# Patient Record
Sex: Female | Born: 1980 | Race: White | Hispanic: No | Marital: Married | Smoking: Never smoker
Health system: Southern US, Community
[De-identification: ages and names within clinical notes are randomized; demographics above are authoritative.]

## PROBLEM LIST (undated history)

## (undated) DIAGNOSIS — R7303 Prediabetes: Secondary | ICD-10-CM

## (undated) DIAGNOSIS — K219 Gastro-esophageal reflux disease without esophagitis: Secondary | ICD-10-CM

## (undated) HISTORY — DX: Gastro-esophageal reflux disease without esophagitis: K21.9

## (undated) HISTORY — DX: Prediabetes: R73.03

## (undated) HISTORY — PX: WISDOM TOOTH EXTRACTION: SHX21

---

## 2020-10-01 ENCOUNTER — Ambulatory Visit (INDEPENDENT_AMBULATORY_CARE_PROVIDER_SITE_OTHER): Payer: Self-pay | Admitting: Primary Care

## 2020-11-04 ENCOUNTER — Other Ambulatory Visit: Payer: Self-pay

## 2020-11-04 ENCOUNTER — Encounter: Payer: Self-pay | Admitting: Internal Medicine

## 2020-11-04 ENCOUNTER — Encounter (INDEPENDENT_AMBULATORY_CARE_PROVIDER_SITE_OTHER): Payer: Self-pay

## 2020-11-04 ENCOUNTER — Ambulatory Visit: Payer: 59 | Attending: Internal Medicine | Admitting: Internal Medicine

## 2020-11-04 VITALS — BP 120/85 | HR 86 | Resp 16 | Ht 63.0 in | Wt 167.4 lb

## 2020-11-04 DIAGNOSIS — F488 Other specified nonpsychotic mental disorders: Secondary | ICD-10-CM | POA: Insufficient documentation

## 2020-11-04 DIAGNOSIS — Z7689 Persons encountering health services in other specified circumstances: Secondary | ICD-10-CM

## 2020-11-04 DIAGNOSIS — E663 Overweight: Secondary | ICD-10-CM | POA: Diagnosis not present

## 2020-11-04 DIAGNOSIS — R7303 Prediabetes: Secondary | ICD-10-CM

## 2020-11-04 DIAGNOSIS — M25552 Pain in left hip: Secondary | ICD-10-CM

## 2020-11-04 DIAGNOSIS — G8929 Other chronic pain: Secondary | ICD-10-CM

## 2020-11-04 DIAGNOSIS — G2589 Other specified extrapyramidal and movement disorders: Secondary | ICD-10-CM

## 2020-11-04 DIAGNOSIS — R030 Elevated blood-pressure reading, without diagnosis of hypertension: Secondary | ICD-10-CM

## 2020-11-04 DIAGNOSIS — K219 Gastro-esophageal reflux disease without esophagitis: Secondary | ICD-10-CM

## 2020-11-04 MED ORDER — PANTOPRAZOLE SODIUM 40 MG PO TBEC: 40.0000 mg | DELAYED_RELEASE_TABLET | Freq: Every day | ORAL | 4 refills | Status: DC

## 2020-11-04 NOTE — Progress Notes (Signed)
Patient ID: Maria Lynch, female    DOB: July 09, 1980  MRN: [id number]  CC: New Patient (Initial Visit)   Subjective: Maria Lynch is a 40 y.o. female who presents for new pt visit Her concerns today include:  Relocated from [facility] 12/2019.  Previous PCP was there. Hx of GERD, preDM  GERD: On Protonix.  She is doing well on the medication.  I inquired whether she had ever stopped the medication or previous PCP tried to take her off of it.  She states that she does not do well when the medication is stopped.  She has recurrence of symptoms.  Prediabetes/overweight: BMI is at 29 today.  She is active.  Walks and does yoga every morning.  She feels her eating habits are good.  Eating fruits and vegetables.  She eats various types of meats, states that she is a little carb heavy and drinks 8-10 alcoholic beverages a week in the form of beer and/or wine.  Saw sports doctor few mths ago for pain LT hip and issues with her RT hand.   Pain in the left hip is intermittent.  She notices it when she is doing yoga or stresses the joint.  No pain when she lays on the left side.  Sometimes it is crampy in nature.  No known injury to the hip.  Had negative x-ray that was done by the sports medicine physician whom she saw a few months ago.  Diagnosed with possible bursitis and was given a course of oral steroid which helped while she was taking it.  However symptoms returned upon completion.  She gets cramps in the right hand mainly when she is chopping something or writing.  She does a lot of typing on the computer.  No numbness.  She sometimes notices tingling in the hand.  Symptoms do not wake her up at nights.  She has not noticed any swelling.  She sometimes wears a wrist strap and compression gloves which help.  HM:  last pap 11/2019 and was nl per her report.  MMG (hx of cysts RT breast) last summer. Completed COVID-19 x 3.    Past medical, surgical, family history, social history reviewed and  updated. 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    No current outpatient medications on file prior to visit.   No current facility-administered medications on file prior to visit.    No Known Allergies  Social History   Socioeconomic History   Marital status: Media planner    Spouse name: Not on file   Number of children: 0   Years of education: Not on file   Highest education level: Doctorate  Occupational History   Occupation: minister  Tobacco Use   Smoking status: Not on file   Smokeless tobacco: Never  Vaping Use   Vaping Use: Never used  Substance and Sexual Activity   Alcohol use: Yes    Alcohol/week: 8.0 - 10.0 standard drinks    Types: 8 - 10 Cans of beer per week   Drug use: Yes    Comment: edible marijuana   Sexual activity: Not on file  Other Topics Concern   Not on file  Social History Narrative   Not on file   Social Determinants of Health   Financial Resource Strain: Not on file  Food Insecurity: Not on file  Transportation Needs: Not on file  Physical Activity: Not on file  Stress: Not on file  Social Connections: Not on file  Intimate  Partner Violence: Not on file    Family History  Problem Relation Age of Onset   Ovarian cancer Maternal Grandmother    Diabetes Maternal Grandfather    Heart disease Paternal Grandmother    ROS: Review of Systems Negative except as stated above  PHYSICAL EXAM: BP 120/85   Pulse 86   Resp 16   Ht 5\' 3"  (1.6 m)   Wt 167 lb 6.4 oz (75.9 kg)   LMP 10/21/2020   SpO2 99%   BMI 29.65 kg/m   Physical Exam  General appearance - alert, well appearing, middle-age Caucasian female and in no distress Mental status - normal mood, behavior, speech, dress, motor activity, and thought processes Eyes - pupils equal and reactive, extraocular eye movements intact Neck - supple, no significant adenopathy.  No thyroid enlargement. Chest - clear to auscultation, no wheezes, rales or rhonchi, symmetric air  entry Heart - normal rate, regular rhythm, normal S1, S2, no murmurs, rubs, clicks or gallops Musculoskeletal -no edema or erythema noted of the wrist hands or fingers.  She has good range of motion at both wrists.  Slight flexion of the PIP joint of the right index finger.  Grip 5/5 bilaterally.  Tinel's sign negative. She has good radial and brachial pulses bilaterally. Left hip: No point tenderness over the trochanteric bursa.  She has good range of motion of the hip with mild discomfort on internal rotation and flexion. Extremities -no lower extremity edema.  Good dorsalis pedis and popliteal pulses.    ASSESSMENT AND PLAN: 1. Encounter to establish care Advised that she signs a release for 12/21/2020 to get her records from her previous PCP.  Once I get her records, I can update her health maintenance.  2. Hip pain, chronic, left Of questionable etiology.  History does not suggest bursitis.  X-ray reportedly negative for any abnormal joint findings.  I recommend referral to Ortho for further evaluation. - Ambulatory referral to Orthopedic Surgery  3. Writer's cramp Recommend trying conservative measures like taking a break from writing when she feels the cramps coming on in her hand, doing some flexion and extension of the fingers intermittently or using a stress ball in the hand intermittently.  4. Overweight (BMI 25.0-29.9) Discussed and encourage healthy eating habits.  Encourage cutting back on portion sizes of white carbohydrates, trying to cut back on sugary drinks in the diet and being mindful even of alcoholic beverages that can be sugary, try to eat more lean white meat instead of red meat and incorporating fresh fruits and vegetables into the diet.  Printed information given.  Continue regular exercise. - CBC - Comprehensive metabolic panel - Lipid panel - Hemoglobin A1c  5. Prediabetes See #4 above - Hemoglobin A1c  6. Gastroesophageal reflux disease without  esophagitis Continue Protonix for now.  However I informed her of some of the things that can develop with long-term use of PPIs including magnesium malabsorption and may increase fracture risk. - pantoprazole (PROTONIX) 40 MG tablet; Take 1 tablet (40 mg total) by mouth daily.  Dispense: 30 tablet; Refill: 4  7. Elevated blood pressure reading DASH diet discussed and encouraged.  Plan to recheck blood pressure on subsequent visit.   Patient was given the opportunity to ask questions.  Patient verbalized understanding of the plan and was able to repeat key elements of the plan.   Orders Placed This Encounter  Procedures   CBC   Comprehensive metabolic panel   Lipid panel   Hemoglobin A1c  Ambulatory referral to Orthopedic Surgery      Requested Prescriptions   Signed Prescriptions Disp Refills   pantoprazole (PROTONIX) 40 MG tablet 30 tablet 4    Sig: Take 1 tablet (40 mg total) by mouth daily.    Return in about 4 months (around 03/06/2021) for Sign release to get med records from previous PCP.  Jonah Blue, MD, FACP

## 2020-11-04 NOTE — Patient Instructions (Signed)
Please sign a release at checkout for [place] to get your records from your previous primary doctor.   Healthy Eating Following a healthy eating pattern may help you to achieve and maintain a healthy body weight, reduce the risk of chronic disease, and live a long and productive life. It is important to follow a healthy eating pattern at an appropriate calorie level for your body. Your nutritional needs should be metprimarily through food by choosing a variety of nutrient-rich foods. What are tips for following this plan? Reading food labels Read labels and choose the following: Reduced or low sodium. Juices with 100% fruit juice. Foods with low saturated fats and high polyunsaturated and monounsaturated fats. Foods with whole grains, such as whole wheat, cracked wheat, brown rice, and wild rice. Whole grains that are fortified with folic acid. This is recommended for women who are pregnant or who want to become pregnant. Read labels and avoid the following: Foods with a lot of added sugars. These include foods that contain brown sugar, corn sweetener, corn syrup, dextrose, fructose, glucose, high-fructose corn syrup, honey, invert sugar, lactose, malt syrup, maltose, molasses, raw sugar, sucrose, trehalose, or turbinado sugar. Do not eat more than the following amounts of added sugar per day: 6 teaspoons (25 g) for women. 9 teaspoons (38 g) for men. Foods that contain processed or refined starches and grains. Refined grain products, such as white flour, degermed cornmeal, white bread, and white rice. Shopping Choose nutrient-rich snacks, such as vegetables, whole fruits, and nuts. Avoid high-calorie and high-sugar snacks, such as potato chips, fruit snacks, and candy. Use oil-based dressings and spreads on foods instead of solid fats such as butter, stick margarine, or cream cheese. Limit pre-made sauces, mixes, and "instant" products such as flavored rice, instant noodles, and ready-made  pasta. Try more plant-protein sources, such as tofu, tempeh, black beans, edamame, lentils, nuts, and seeds. Explore eating plans such as the Mediterranean diet or vegetarian diet. Cooking Use oil to saut or stir-fry foods instead of solid fats such as butter, stick margarine, or lard. Try baking, boiling, grilling, or broiling instead of frying. Remove the fatty part of meats before cooking. Steam vegetables in water or broth. Meal planning  At meals, imagine dividing your plate into fourths: One-half of your plate is fruits and vegetables. One-fourth of your plate is whole grains. One-fourth of your plate is protein, especially lean meats, poultry, eggs, tofu, beans, or nuts. Include low-fat dairy as part of your daily diet.  Lifestyle Choose healthy options in all settings, including home, work, school, restaurants, or stores. Prepare your food safely: Wash your hands after handling raw meats. Keep food preparation surfaces clean by regularly washing with hot, soapy water. Keep raw meats separate from ready-to-eat foods, such as fruits and vegetables. Cook seafood, meat, poultry, and eggs to the recommended internal temperature. Store foods at safe temperatures. In general: Keep cold foods at 47F (4.4C) or below. Keep hot foods at 147F (60C) or above. Keep your freezer at [facility] (-17.8C) or below. Foods are no longer safe to eat when they have been between the temperatures of 40-147F (4.4-60C) for more than 2 hours. What foods should I eat? Fruits Aim to eat 2 cup-equivalents of fresh, canned (in natural juice), or frozen fruits each day. Examples of 1 cup-equivalent of fruit include 1 small apple, 8large strawberries, 1 cup canned fruit,  cup dried fruit, or 1 cup 100% juice. Vegetables Aim to eat 2-3 cup-equivalents of fresh and frozen vegetables each day,  including different varieties and colors. Examples of 1 cup-equivalent of vegetables include 2 medium carrots, 2 cups  raw, leafy greens, 1 cup choppedvegetable (raw or cooked), or 1 medium baked potato. Grains Aim to eat 6 ounce-equivalents of whole grains each day. Examples of 1 ounce-equivalent of grains include 1 slice of bread, 1 cup ready-to-eat cereal,3 cups popcorn, or  cup cooked rice, pasta, or cereal. Meats and other proteins Aim to eat 5-6 ounce-equivalents of protein each day. Examples of 1 ounce-equivalent of protein include 1 egg, 1/2 cup nuts or seeds, or 1 tablespoon (16 g) peanut butter. A cut of meat or fish that is the size of a deck of cards is about 3-4 ounce-equivalents. Of the protein you eat each week, try to have at least 8 ounces come from seafood. This includes salmon, trout, herring, and anchovies. Dairy Aim to eat 3 cup-equivalents of fat-free or low-fat dairy each day. Examples of 1 cup-equivalent of dairy include 1 cup (240 mL) milk, 8 ounces (250 g) yogurt,1 ounces (44 g) natural cheese, or 1 cup (240 mL) fortified soy milk. Fats and oils Aim for about 5 teaspoons (21 g) per day. Choose monounsaturated fats, such as canola and olive oils, avocados, peanut butter, and most nuts, or polyunsaturated fats, such as sunflower, corn, and soybean oils, walnuts, pine nuts, sesame seeds, sunflower seeds, and flaxseed. Beverages Aim for six 8-oz glasses of water per day. Limit coffee to three to five 8-oz cups per day. Limit caffeinated beverages that have added calories, such as soda and energy drinks. Limit alcohol intake to no more than 1 drink a day for nonpregnant women and 2 drinks a day for men. One drink equals 12 oz of beer (355 mL), 5 oz of wine (148 mL), or 1 oz of hard liquor (44 mL). Seasoning and other foods Avoid adding excess amounts of salt to your foods. Try flavoring foods with herbs and spices instead of salt. Avoid adding sugar to foods. Try using oil-based dressings, sauces, and spreads instead of solid fats. This information is based on general [place] nutrition  guidelines. For more information, visit [employer]. Exact amounts may vary based on your nutrition needs. Summary A healthy eating plan may help you to maintain a healthy weight, reduce the risk of chronic diseases, and stay active throughout your life. Plan your meals. Make sure you eat the right portions of a variety of nutrient-rich foods. Try baking, boiling, grilling, or broiling instead of frying. Choose healthy options in all settings, including home, work, school, restaurants, or stores. This information is not intended to replace advice given to you by your health care provider. Make sure you discuss any questions you have with your healthcare provider. Document Revised: 08/15/2017 Document Reviewed: 08/15/2017 Elsevier Patient Education  2022 [employer].

## 2020-11-05 LAB — LIPID PANEL
Chol/HDL Ratio: 3.4 ratio (ref 0.0–4.4)
Cholesterol, Total: 186 mg/dL (ref 100–199)
HDL: 54 mg/dL (ref >39)
LDL Chol Calc (NIH): 110 mg/dL — ABNORMAL HIGH (ref 0–99)
Triglycerides: 121 mg/dL (ref 0–149)
VLDL Cholesterol Cal: 22 mg/dL (ref 5–40)

## 2020-11-05 LAB — COMPREHENSIVE METABOLIC PANEL
ALT: 20 IU/L (ref 0–32)
AST: 16 IU/L (ref 0–40)
Albumin/Globulin Ratio: 1.5 (ref 1.2–2.2)
Albumin: 4.3 g/dL (ref 3.8–4.8)
Alkaline Phosphatase: 63 IU/L (ref 44–121)
BUN/Creatinine Ratio: 16 (ref 9–23)
BUN: 12 mg/dL (ref 6–24)
Bilirubin Total: 0.3 mg/dL (ref 0.0–1.2)
CO2: 20 mmol/L (ref 20–29)
Calcium: 9.9 mg/dL (ref 8.7–10.2)
Chloride: 101 mmol/L (ref 96–106)
Creatinine, Ser: 0.73 mg/dL (ref 0.57–1.00)
Globulin, Total: 2.9 g/dL (ref 1.5–4.5)
Glucose: 89 mg/dL (ref 65–99)
Potassium: 4.7 mmol/L (ref 3.5–5.2)
Sodium: 138 mmol/L (ref 134–144)
Total Protein: 7.2 g/dL (ref 6.0–8.5)
eGFR: 107 mL/min/{1.73_m2} (ref >59)

## 2020-11-05 LAB — CBC
Hematocrit: 41.4 % (ref 34.0–46.6)
Hemoglobin: 13.9 g/dL (ref 11.1–15.9)
MCH: 30.7 pg (ref 26.6–33.0)
MCHC: 33.6 g/dL (ref 31.5–35.7)
MCV: 91 fL (ref 79–97)
Platelets: 324 10*3/uL (ref 150–450)
RBC: 4.53 x10E6/uL (ref 3.77–5.28)
RDW: 12.5 % (ref 11.7–15.4)
WBC: 9 10*3/uL (ref 3.4–10.8)

## 2020-11-05 LAB — HEMOGLOBIN A1C
Est. average glucose Bld gHb Est-mCnc: 114 mg/dL
Hgb A1c MFr Bld: 5.6 % (ref 4.8–5.6)

## 2020-11-05 NOTE — Progress Notes (Signed)
Blood cell counts including red blood cells, white blood cells and platelet counts are normal. Kidney and liver function tests are normal. Blood sugar level normal.  She is no longer in the range for prediabetes. LDL cholesterol is 110 with goal being less than 100.  Healthy eating habits and regular exercise as discussed on recent visit will help to lower cholesterol.

## 2020-11-12 ENCOUNTER — Encounter: Payer: Self-pay | Admitting: Internal Medicine

## 2020-11-12 NOTE — Progress Notes (Signed)
Records received from Dr. Katherina Mires.  Notes are hand written.  Patient with diagnosis of GERD, prediabetes, fibrocystic breast disease.  Last LDL cholesterol is in May 2021 and it was 143, A1c was 5.9.  CBC was normal.

## 2020-11-13 ENCOUNTER — Encounter: Payer: Self-pay | Admitting: Orthopaedic Surgery

## 2020-11-13 ENCOUNTER — Ambulatory Visit (INDEPENDENT_AMBULATORY_CARE_PROVIDER_SITE_OTHER): Payer: 59

## 2020-11-13 ENCOUNTER — Ambulatory Visit (INDEPENDENT_AMBULATORY_CARE_PROVIDER_SITE_OTHER): Payer: 59 | Admitting: Orthopaedic Surgery

## 2020-11-13 ENCOUNTER — Telehealth: Payer: Self-pay

## 2020-11-13 VITALS — Ht 63.0 in | Wt 167.0 lb

## 2020-11-13 DIAGNOSIS — M25552 Pain in left hip: Secondary | ICD-10-CM

## 2020-11-13 MED ORDER — LIDOCAINE HCL 1 % IJ SOLN
3.0000 mL | INTRAMUSCULAR | Status: AC | PRN
  Administered 2020-11-13: 3 mL

## 2020-11-13 MED ORDER — METHYLPREDNISOLONE ACETATE 40 MG/ML IJ SUSP
40.0000 mg | INTRAMUSCULAR | Status: AC | PRN
  Administered 2020-11-13: 40 mg via INTRA_ARTICULAR

## 2020-11-13 MED ORDER — BUPIVACAINE HCL 0.25 % IJ SOLN
2.0000 mL | INTRAMUSCULAR | Status: AC | PRN
  Administered 2020-11-13: 2 mL via INTRA_ARTICULAR

## 2020-11-13 NOTE — Telephone Encounter (Signed)
Contacted pt to go over lab results pt didn't answer lvm   Sent a CRM and forward labs to NT to give pt labs when they call back   

## 2020-11-13 NOTE — Progress Notes (Signed)
Office Visit Note   Patient: Maria Lynch           Date of Birth: [birth year]           MRN: [id number] Visit Date: 11/13/2020              Requested by: Marcine Matar, MD [address] [place],  [place] [address] PCP: Marcine Matar, MD   Assessment & Plan: Visit Diagnoses:  1. Pain in left hip     Plan: Impression is chronic lateral hip pain.  The patient symptoms are mostly consistent with trochanteric bursitis given her clinical exam findings even though I think she could have a component of lumbar radiculopathy based on her subjective weakness.  At this point, we have discussed trochanteric bursa injection in addition to iliac band stretching which she is agreeable to.  Should her symptoms not improve over the next several weeks she will let us know.  She will call us with concerns or questions in the meantime.  Follow-Up Instructions: Return if symptoms worsen or fail to improve.   Orders:  Orders Placed This Encounter  Procedures   Large Joint Inj: L greater trochanter   XR HIP UNILAT W OR W/O PELVIS 2-3 VIEWS LEFT   XR Lumbar Spine 2-3 Views   No orders of the defined types were placed in this encounter.     Procedures: Large Joint Inj: L greater trochanter on 11/13/2020 10:00 AM Indications: pain Details: 22 G needle, lateral approach Medications: 3 mL lidocaine 1 %; 2 mL bupivacaine 0.25 %; 40 mg methylPREDNISolone acetate 40 MG/ML     Clinical Data: No additional findings.   Subjective: Chief Complaint  Patient presents with   Left Hip - Pain    HPI patient is a pleasant 40 year old female who comes in today with chronic left hip pain.  She noticed this approximately 9 months ago.  No known injury or change in activity.  The pain is primarily to the lateral hip but occasionally radiates to the anterior thigh.  She describes this as a constant ache with frequent cramping of the left hip and left lower leg.  Her symptoms appear to be worse when  she is going downstairs, squatting, flexing her hip or after sitting for a long period of time.  She has been taking Advil as well as a recent steroid taper which did seem to help.  She notes occasional tingling to the lateral hip.  She denies any previous history of lumbar pathology other than some intermittent pain in the past.  Review of Systems as detailed in HPI.  All others reviewed and are negative.   Objective: Vital Signs: Ht 5\' 3"  (1.6 m)   Wt 167 lb (75.8 kg)   LMP 10/21/2020   BMI 29.58 kg/m   Physical Exam well-nourished female no acute distress.  Alert and oriented x3.  Ortho Exam left hip exam reveals a negative logroll and negative FADIR.  She does have moderate tenderness along the greater trochanter.  Mild increased pain with lumbar extension.  No pain with lumbar flexion.  Negative straight leg raise.  No focal weakness.  She is neurovascular intact distally.  Specialty Comments:  No specialty comments available.  Imaging: XR HIP UNILAT W OR W/O PELVIS 2-3 VIEWS LEFT  Result Date: 11/13/2020 No acute or structural abnormalities  XR Lumbar Spine 2-3 Views  Result Date: 11/13/2020 moderate disc space narrowing L5-S1    PMFS History: Patient Active Problem List  Diagnosis Date Noted   Hip pain, chronic, left 11/04/2020   Writer's cramp 11/04/2020   Overweight (BMI 25.0-29.9) 11/04/2020   Prediabetes 11/04/2020   Gastroesophageal reflux disease without esophagitis 11/04/2020   Past Medical History:  Diagnosis Date   GERD (gastroesophageal reflux disease)    Prediabetes     Family History  Problem Relation Age of Onset   Ovarian cancer Maternal Grandmother    Diabetes Maternal Grandfather    Heart disease Paternal Grandmother     Past Surgical History:  Procedure Laterality Date   WISDOM TOOTH EXTRACTION     Social History   Occupational History   Occupation: Optician, dispensing  Tobacco Use   Smoking status: Never   Smokeless tobacco: Never  Vaping Use    Vaping Use: Never used  Substance and Sexual Activity   Alcohol use: Yes    Alcohol/week: 8.0 - 10.0 standard drinks    Types: 8 - 10 Cans of beer per week   Drug use: Yes    Comment: edible marijuana   Sexual activity: Not on file

## 2020-11-20 ENCOUNTER — Telehealth: Payer: Self-pay | Admitting: Internal Medicine

## 2020-11-20 DIAGNOSIS — Z1231 Encounter for screening mammogram for malignant neoplasm of breast: Secondary | ICD-10-CM

## 2020-11-20 DIAGNOSIS — N6011 Diffuse cystic mastopathy of right breast: Secondary | ICD-10-CM

## 2020-11-20 DIAGNOSIS — N6019 Diffuse cystic mastopathy of unspecified breast: Secondary | ICD-10-CM

## 2020-11-20 NOTE — Telephone Encounter (Signed)
Returned pt call to see why she is needing a breast ultrasound. Pt didn't answer lvm

## 2020-11-20 NOTE — Telephone Encounter (Signed)
Patient called in wanting to speak to [facility] but no answer please call patient at Ph# [phone]

## 2020-11-20 NOTE — Telephone Encounter (Signed)
Copied from CRM [id number]. Topic: General - Other >> Nov 14, 2020 12:10 PM Traci Sermon wrote: Reason for CRM: Pt called in wanting to get orders for Diagnose, for [facility], for a breast Ultrasound, Please advise.

## 2020-11-21 NOTE — Telephone Encounter (Signed)
Returned pt call to get more information on why she needing is ultrasound done. Pt states she had an appt scheduled for 01/23/21 with the breast center but they canceled it because they are needing an order entered to have detail Diag and possible U/S due to current issues. Pt states she has a cluster of cysts in my right breast

## 2020-11-27 ENCOUNTER — Other Ambulatory Visit: Payer: Self-pay | Admitting: Internal Medicine

## 2020-11-27 DIAGNOSIS — Z1231 Encounter for screening mammogram for malignant neoplasm of breast: Secondary | ICD-10-CM

## 2020-12-15 ENCOUNTER — Encounter: Payer: Self-pay | Admitting: Orthopaedic Surgery

## 2020-12-16 ENCOUNTER — Other Ambulatory Visit: Payer: Self-pay

## 2020-12-16 DIAGNOSIS — M25552 Pain in left hip: Secondary | ICD-10-CM

## 2021-01-01 ENCOUNTER — Ambulatory Visit: Payer: 59 | Admitting: Rehabilitative and Restorative Service Providers"

## 2021-01-05 ENCOUNTER — Encounter: Payer: Self-pay | Admitting: Physical Therapy

## 2021-01-05 ENCOUNTER — Other Ambulatory Visit: Payer: Self-pay

## 2021-01-05 ENCOUNTER — Ambulatory Visit (INDEPENDENT_AMBULATORY_CARE_PROVIDER_SITE_OTHER): Payer: 59 | Admitting: Physical Therapy

## 2021-01-05 DIAGNOSIS — R2689 Other abnormalities of gait and mobility: Secondary | ICD-10-CM | POA: Diagnosis not present

## 2021-01-05 DIAGNOSIS — M25652 Stiffness of left hip, not elsewhere classified: Secondary | ICD-10-CM

## 2021-01-05 DIAGNOSIS — M25552 Pain in left hip: Secondary | ICD-10-CM

## 2021-01-05 DIAGNOSIS — M6281 Muscle weakness (generalized): Secondary | ICD-10-CM | POA: Diagnosis not present

## 2021-01-05 NOTE — Patient Instructions (Signed)
Access Code: [email or web address] URL: [email or web address] Date: 01/05/2021 Prepared by: Moshe Cipro  Exercises Supine Bridge - 1-2 x daily - 7 x weekly - 2-3 sets - 10 reps - 5 sec hold Clamshell with Resistance - 1-2 x daily - 7 x weekly - 2-3 sets - 10 reps Mini Squat with Counter Support - 1-2 x daily - 7 x weekly - 2-3 sets - 10 reps Kettlebell Deadlift - 1-2 x daily - 7 x weekly - 2-3 sets - 10 reps Standing Single Leg Stance with Unilateral Counter Support - 1-2 x daily - 7 x weekly - 2-3 sets - 3 reps - 10 sec hold  Patient Education Trigger Point Dry Needling

## 2021-01-05 NOTE — Therapy (Signed)
[facility] Physical Therapy [address] [place], [place], [address] Phone: [phone]   Fax:  [phone]  Physical Therapy Evaluation  Patient Details  Name: Maria Lynch MRN: [id number] Date of Birth: 07-18-1980 Referring Provider (PT): Tarry Kos, MD   Encounter Date: 01/05/2021   PT End of Session - 01/05/21 1103     Visit Number 1    Number of Visits 6    Date for PT Re-Evaluation 02/16/21    Authorization Type [employer]    PT Start Time 1018    PT Stop Time 1056    PT Time Calculation (min) 38 min    Activity Tolerance Patient tolerated treatment well    Behavior During Therapy [facility] for tasks assessed/performed             Past Medical History:  Diagnosis Date   GERD (gastroesophageal reflux disease)    Prediabetes     Past Surgical History:  Procedure Laterality Date   WISDOM TOOTH EXTRACTION      There were no vitals filed for this visit.    Subjective Assessment - 01/05/21 1020     Subjective Pt is a 40 y/o female who presents to OPPT for Lt hip pain since Oct 2021 without known injury.  She reports prednisone was mildly helpful, as well as injection which hasn't provided significant relief.  Xrays of hip WNL, mild arthritis in back.    Limitations Walking    How long can you walk comfortably? starts limping, walks 20-30 min most days    Diagnostic tests xrays    Patient Stated Goals improve pain, walk without a limp, go up/down stairs    Currently in Pain? Yes    Pain Score 2    up to 4/10; at best 0/10   Pain Location Hip    Pain Orientation Left    Pain Descriptors / Indicators Aching;Dull;Cramping    Pain Type Chronic pain    Pain Onset More than a month ago    Pain Frequency Intermittent    Aggravating Factors  walking, stairs, intercourse    Pain Relieving Factors stretches (ITB), rest, advil                [facility] PT Assessment - 01/05/21 1025       Assessment   Medical Diagnosis M25.552 (ICD-10-CM)  - Pain in left hip  chronic    Referring Provider (PT) Tarry Kos, MD    Onset Date/Surgical Date --   Oct 2021   Hand Dominance Right    Next MD Visit PRN    Prior Therapy none recently      Precautions   Precautions None      Restrictions   Weight Bearing Restrictions No      Balance Screen   Has the patient fallen in the past 6 months No    Has the patient had a decrease in activity level because of a fear of falling?  No    Is the patient reluctant to leave their home because of a fear of falling?  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, some travel    [employer], watch TV, yoga every morning, daily walking      Cognition   Overall Cognitive Status Within Functional Limits for tasks assessed      Observation/Other Assessments   Focus on Therapeutic Outcomes (FOTO)  59 (predicted 70)      ROM / Strength   AROM / PROM / Strength AROM;Strength      AROM   Overall AROM Comments other hip motions grossly WNL    AROM Assessment Site Hip    Right/Left Hip Right;Left    Right Hip External Rotation  27    Right Hip Internal Rotation  29    Left Hip External Rotation  16    Left Hip Internal Rotation  27      Strength   Strength Assessment Site Hip;Knee    Right/Left Hip Right;Left    Right Hip Flexion 4/5    Right Hip Extension 4/5    Right Hip External Rotation  3+/5    Right Hip Internal Rotation 4/5    Left Hip Flexion 3+/5    Left Hip Extension 3+/5    Left Hip External Rotation 3/5    Left Hip Internal Rotation 3/5    Left Hip ABduction 4/5    Right/Left Knee Right;Left     Right Knee Flexion 5/5    Right Knee Extension 5/5    Left Knee Flexion 5/5    Left Knee Extension 5/5      Palpation   Palpation comment trigger points noted in Lt TFL and glute min/piriformis; tender to palpation at post greater trochanter      Special Tests    Special Tests Lumbar    Lumbar Tests Slump Test      Slump test   Findings Negative      Ambulation/Gait   Gait Comments no significant deviations noted during short distances in clinic, pt reports increased antalgic gait with walking longer distances                        Objective measurements completed on examination: See above findings.       [facility] Adult PT Treatment/Exercise - 01/05/21 1025       Exercises   Exercises Other Exercises    Other Exercises  verbally reviewed strengthening exercises with pt - will formally review next visit; recommended continued current exercise program as well including stretches, yoga and daily walking      Manual Therapy   Manual Therapy Soft tissue mobilization    Soft tissue mobilization Lt TFL with compression              Trigger Point Dry Needling - 01/05/21 1107     Consent Given? Yes    Education Handout Provided Yes    Muscles Treated Back/Hip Tensor fascia lata    Tensor Fascia Lata Response Twitch response elicited                  PT Education - 01/05/21 1102     Education Details HEP, DN    Person(s) Educated Patient    Methods Explanation;Demonstration;Handout    Comprehension Verbalized understanding;Returned demonstration;Need further instruction              PT Short Term Goals - 01/05/21 1111       PT SHORT TERM GOAL #1  Title independent with initial HEP    Time 3    Period Weeks    Status New    Target Date 01/26/21               PT Long Term Goals - 01/05/21 1111       PT LONG TERM GOAL #1   Title independent with final HEP    Time 6    Period Weeks    Status New    Target Date 02/16/21       PT LONG TERM GOAL #2   Title FOTO score improved to 70 for improved function    Time 6    Period Weeks    Status New    Target Date 02/16/21      PT LONG TERM GOAL #3   Title demonstrate at least 4/5 Lt hip strength for improved function    Time 6    Period Weeks    Status New    Target Date 02/16/21      PT LONG TERM GOAL #4   Title negotiate stairs reciprocally without increase in pain for improved function    Time 6    Period Weeks    Status New    Target Date 02/16/21      PT LONG TERM GOAL #5   Title report pain < 3/10 with increased activity for improved function    Time 6    Period Weeks    Status New    Target Date 02/16/21                    Plan - 01/05/21 1107     Clinical Impression Statement Pt is a 40 y/o female who presents to OPPT for chronic Lt hip pain.  She presents with decreased strength (Lt > Rt) as well as decreased ROM, and noted trigger points in Lt hip muscles.  Trial of DN to TFL today to see how pt responds.  Pt will benefit from PT to address deficits listed.    Personal Factors and Comorbidities Fitness;Time since onset of injury/illness/exacerbation    Examination-Activity Limitations Locomotion Level;Transfers;Stand;Stairs    Examination-Participation Restrictions Church;Yard Work;Community Activity;Occupation;Driving;Interpersonal Relationship    Stability/Clinical Decision Making Evolving/Moderate complexity    Clinical Decision Making Moderate    Rehab Potential Good    PT Frequency 1x / week    PT Duration 6 weeks    PT Treatment/Interventions ADLs/Self Care Home Management;Cryotherapy;Electrical Stimulation;Ultrasound;Traction;Moist Heat;Gait training;Stair training;Functional mobility training;Therapeutic activities;Therapeutic exercise;Balance training;Patient/family education;Neuromuscular re-education;Manual techniques;Taping;Dry needling;Passive range of motion    PT Next Visit Plan review HEP, assess response to  DN/manual therapy, continue with strengthening and modalities PRN    PT Home Exercise Plan Access Code: [email or web address]    Consulted and Agree with Plan of Care Patient             Patient will benefit from skilled therapeutic intervention in order to improve the following deficits and impairments:  Abnormal gait, Increased fascial restricitons, Pain, Increased muscle spasms, Decreased mobility, Decreased range of motion, Decreased strength, Impaired flexibility, Difficulty walking, Decreased balance  Visit Diagnosis: Pain in left hip - Plan: PT plan of care cert/re-cert  Stiffness of left hip, not elsewhere classified - Plan: PT plan of care cert/re-cert  Muscle weakness (generalized) - Plan: PT plan of care cert/re-cert  Other abnormalities of gait and mobility - Plan: PT plan of care cert/re-cert     Problem List Patient Active Problem List   Diagnosis  Date Noted   Hip pain, chronic, left 11/04/2020   Writer's cramp 11/04/2020   Overweight (BMI 25.0-29.9) 11/04/2020   Prediabetes 11/04/2020   Gastroesophageal reflux disease without esophagitis 11/04/2020      Clarita CraneStephanie F , PT, DPT 01/05/21 11:15 AM     [facility] [address] [place], [place], [address] Phone: [phone]   Fax:  [phone]  Name: Maria Kubashley Lynch MRN: [id number] Date of Birth: 02/29/1981

## 2021-01-21 ENCOUNTER — Ambulatory Visit (INDEPENDENT_AMBULATORY_CARE_PROVIDER_SITE_OTHER): Payer: 59 | Admitting: Physical Therapy

## 2021-01-21 ENCOUNTER — Other Ambulatory Visit: Payer: Self-pay

## 2021-01-21 ENCOUNTER — Encounter: Payer: Self-pay | Admitting: Physical Therapy

## 2021-01-21 DIAGNOSIS — M25552 Pain in left hip: Secondary | ICD-10-CM

## 2021-01-21 DIAGNOSIS — M6281 Muscle weakness (generalized): Secondary | ICD-10-CM

## 2021-01-21 DIAGNOSIS — M25652 Stiffness of left hip, not elsewhere classified: Secondary | ICD-10-CM

## 2021-01-21 DIAGNOSIS — R2689 Other abnormalities of gait and mobility: Secondary | ICD-10-CM | POA: Diagnosis not present

## 2021-01-21 NOTE — Therapy (Signed)
[facility] Physical Therapy [address] [place], [place], [address] Phone: [phone]   Fax:  [phone]  Physical Therapy Treatment  Patient Details  Name: Maria Lynch MRN: [id number] Date of Birth: November 12, 1980 Referring Provider (PT): Tarry Kos, MD   Encounter Date: 01/21/2021   PT End of Session - 01/21/21 1512     Visit Number 2    Number of Visits 6    Date for PT Re-Evaluation 02/16/21    Authorization Type [employer]    PT Start Time 1435    PT Stop Time 1510    PT Time Calculation (min) 35 min    Activity Tolerance Patient tolerated treatment well    Behavior During Therapy [facility] for tasks assessed/performed             Past Medical History:  Diagnosis Date   GERD (gastroesophageal reflux disease)    Prediabetes     Past Surgical History:  Procedure Laterality Date   WISDOM TOOTH EXTRACTION      There were no vitals filed for this visit.   Subjective Assessment - 01/21/21 1437     Subjective feels like gait is improving, and hip is feeling better.  fell one time while in [place].  stairs were a challenge though.    Limitations Walking    How long can you walk comfortably? starts limping, walks 20-30 min most days    Diagnostic tests xrays    Patient Stated Goals improve pain, walk without a limp, go up/down stairs    Currently in Pain? No/denies                               [facility] Adult PT Treatment/Exercise - 01/21/21 1438       Exercises   Exercises Knee/Hip    Other Exercises  reviewed HEP and discussed cues for squatting, also ways to progress SLS to incorporate glute med activation      Knee/Hip Exercises: Stretches   Other Knee/Hip Stretches prone on elbows x 3 min      Knee/Hip Exercises: Aerobic   Recumbent Bike L3 x 6 min      Manual Therapy   Manual Therapy Soft tissue mobilization    Soft tissue mobilization Lt TFL and glute med with compression, also used percussive device for STM  to glute med              Trigger Point Dry Needling - 01/21/21 1512     Consent Given? Yes    Education Handout Provided Previously provided    Muscles Treated Back/Hip Gluteus medius;Tensor fascia lata    Gluteus Medius Response Twitch response elicited    Tensor Fascia Lata Response Twitch response elicited                Upper Extremity Functional Index Score :   /80     PT Short Term Goals - 01/21/21 1513       PT SHORT TERM GOAL #1   Title independent with initial HEP    Time 3    Period Weeks    Status Achieved    Target Date 01/26/21               PT Long Term Goals - 01/21/21 1513       PT LONG TERM GOAL #1   Title independent with final HEP    Time 6    Period Weeks  Status On-going    Target Date 02/16/21      PT LONG TERM GOAL #2   Title FOTO score improved to 70 for improved function    Time 6    Period Weeks    Status On-going    Target Date 02/16/21      PT LONG TERM GOAL #3   Title demonstrate at least 4/5 Lt hip strength for improved function    Time 6    Period Weeks    Status On-going    Target Date 02/16/21      PT LONG TERM GOAL #4   Title negotiate stairs reciprocally without increase in pain for improved function    Time 6    Period Weeks    Status On-going    Target Date 02/16/21      PT LONG TERM GOAL #5   Title report pain < 3/10 with increased activity for improved function    Time 6    Period Weeks    Status On-going    Target Date 02/16/21                   Plan - 01/21/21 1514     Clinical Impression Statement Pt tolerated session well today with positive initial response to manual therapy and DN.  Still has some difficulty with stairs and standing from lower surfaces so will continue to benefit from PT to maximize function.  Did notice Lt > Rt foot slap today with amb, which pt reports may be baseline.  Advised her to monitor for any possible changes and if any weakness progresses to  contact MD.    Personal Factors and Comorbidities Fitness;Time since onset of injury/illness/exacerbation    Examination-Activity Limitations Locomotion Level;Transfers;Stand;Stairs    Examination-Participation Restrictions Church;Yard Work;Community Activity;Occupation;Driving;Interpersonal Relationship    Stability/Clinical Decision Making Evolving/Moderate complexity    Rehab Potential Good    PT Frequency 1x / week    PT Duration 6 weeks    PT Treatment/Interventions ADLs/Self Care Home Management;Cryotherapy;Electrical Stimulation;Ultrasound;Traction;Moist Heat;Gait training;Stair training;Functional mobility training;Therapeutic activities;Therapeutic exercise;Balance training;Patient/family education;Neuromuscular re-education;Manual techniques;Taping;Dry needling;Passive range of motion    PT Next Visit Plan assess response to DN/manual therapy, continue with strengthening and modalities PRN, reassess strength    PT Home Exercise Plan Access Code: [email or web address]    Consulted and Agree with Plan of Care Patient             Patient will benefit from skilled therapeutic intervention in order to improve the following deficits and impairments:  Abnormal gait, Increased fascial restricitons, Pain, Increased muscle spasms, Decreased mobility, Decreased range of motion, Decreased strength, Impaired flexibility, Difficulty walking, Decreased balance  Visit Diagnosis: Pain in left hip  Stiffness of left hip, not elsewhere classified  Muscle weakness (generalized)  Other abnormalities of gait and mobility     Problem List Patient Active Problem List   Diagnosis Date Noted   Hip pain, chronic, left 11/04/2020   Writer's cramp 11/04/2020   Overweight (BMI 25.0-29.9) 11/04/2020   Prediabetes 11/04/2020   Gastroesophageal reflux disease without esophagitis 11/04/2020    Moshe Cipro, PT, DPT 01/21/2021, 3:16 PM  [facility] Physical Therapy [address] [place], [place], [address] Phone: [phone]   Fax:  [phone]  Name: Maria Lynch MRN: [id number] Date of Birth: 1980/07/02

## 2021-01-22 ENCOUNTER — Ambulatory Visit
Admission: RE | Admit: 2021-01-22 | Discharge: 2021-01-22 | Disposition: A | Discharge status: Home / Self Care | Payer: 59 | Source: Ambulatory Visit | Attending: Internal Medicine | Admitting: Internal Medicine

## 2021-01-22 DIAGNOSIS — Z1231 Encounter for screening mammogram for malignant neoplasm of breast: Secondary | ICD-10-CM

## 2021-01-22 IMAGING — MG MM DIGITAL SCREENING BILAT W/ TOMO AND CAD
8 series · 8 of 24 positions shown · non-contrast
Comparison: Previous exam(s).

CLINICAL DATA: Screening.

EXAM:
DIGITAL SCREENING BILATERAL MAMMOGRAM WITH TOMOSYNTHESIS AND CAD
TECHNIQUE: Bilateral screening digital craniocaudal and mediolateral oblique
mammograms were obtained. Bilateral screening digital breast
tomosynthesis was performed. The images were evaluated with
computer-aided detection.

[L CC synth-2D]
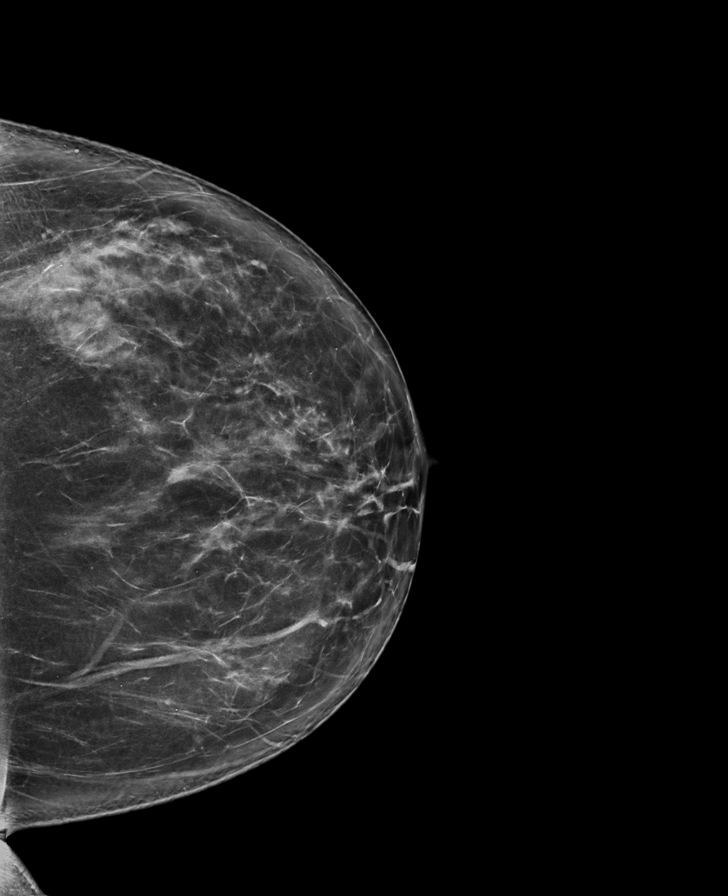

[R MLO synth-2D]
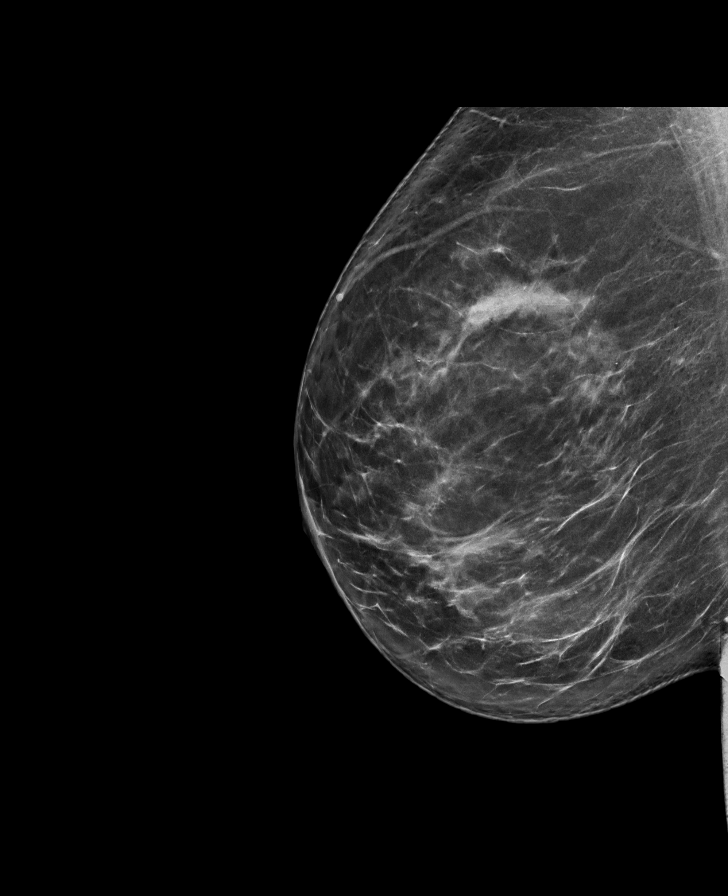

[L MLO synth-2D]
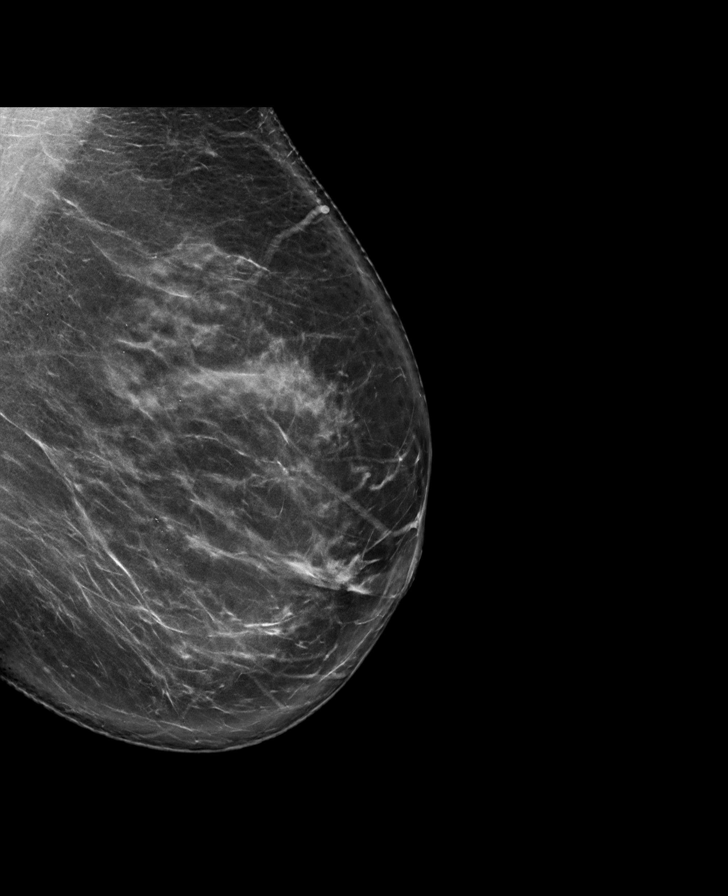

[R CC synth-2D]
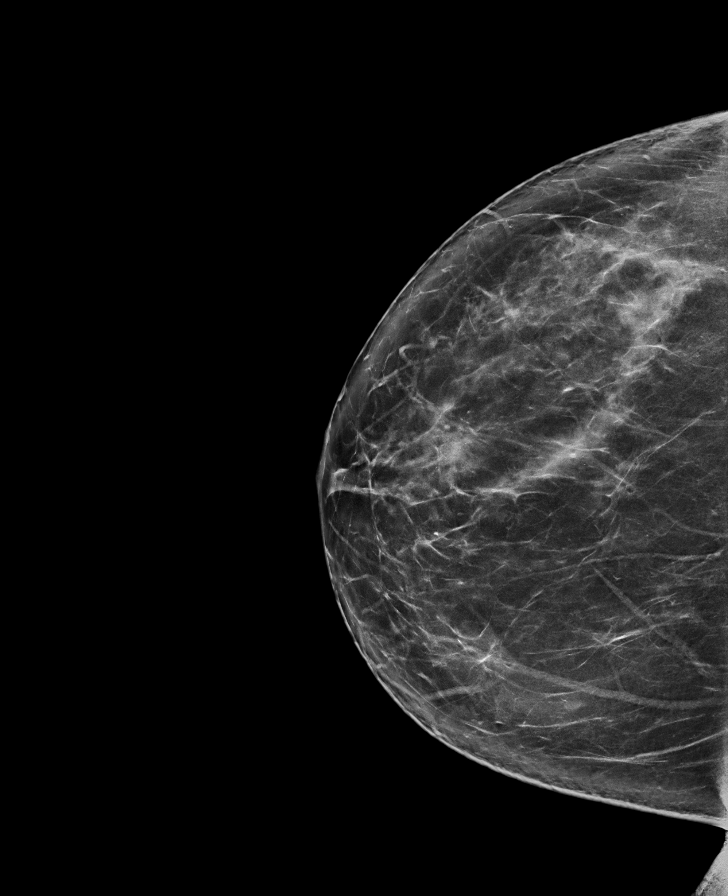

[R CC tomo · tomo slice 45/88.0]
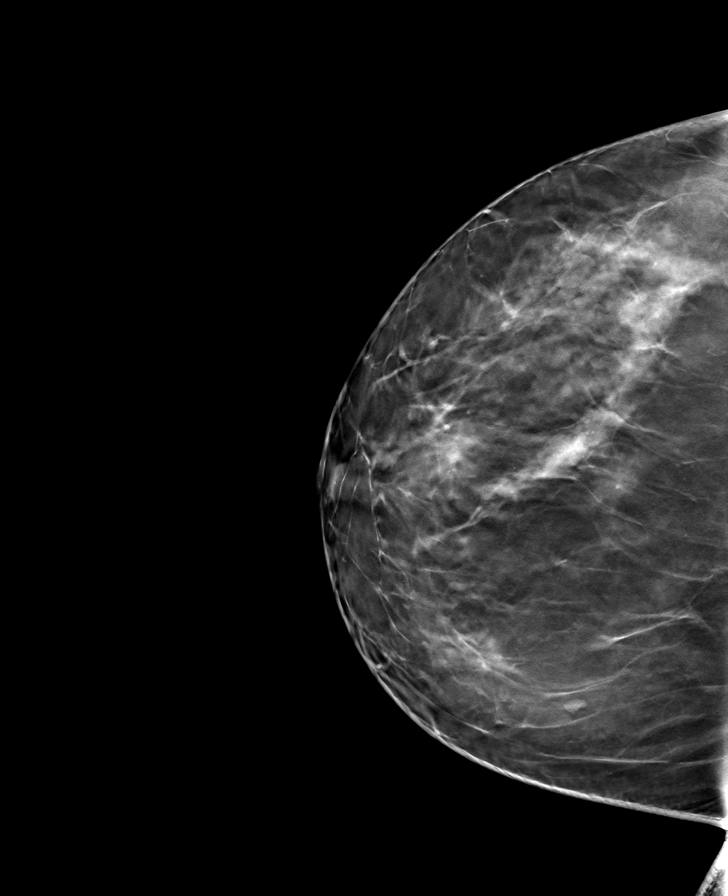

[L MLO tomo · tomo slice 51/100.0]
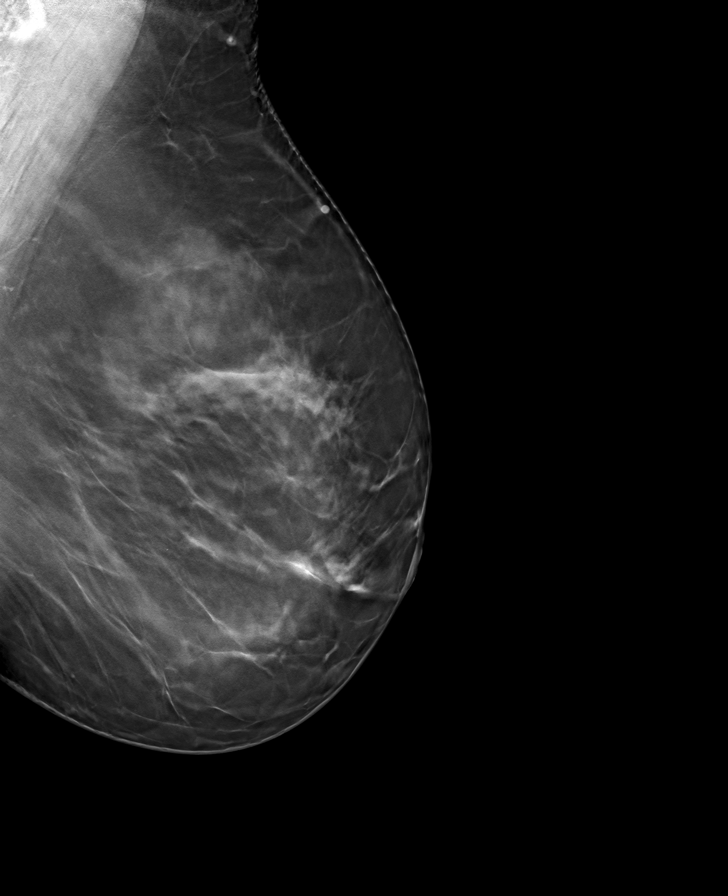

[R MLO tomo · tomo slice 49/96.0]
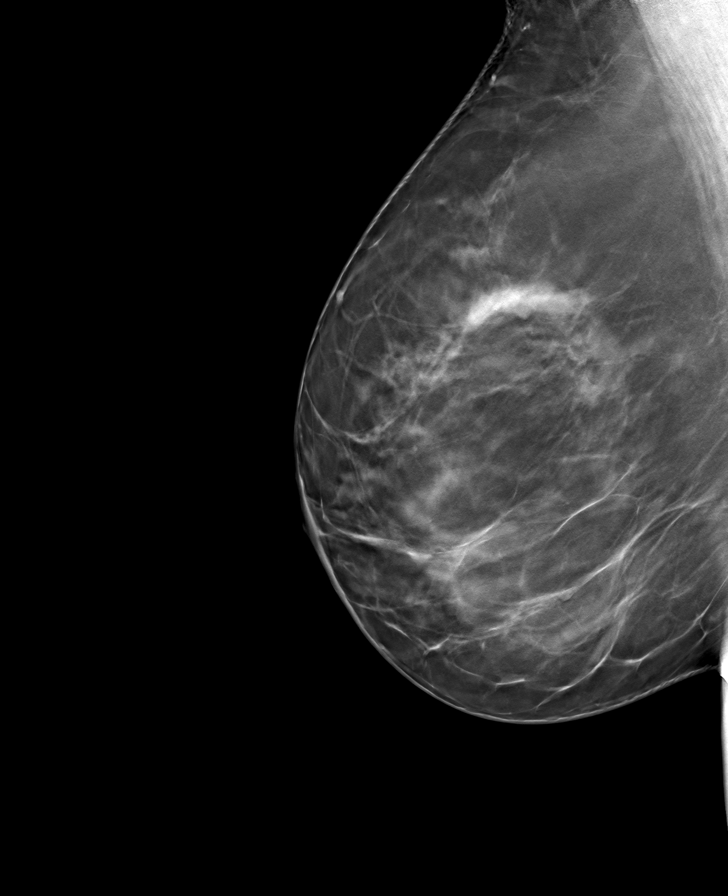

[L CC tomo · tomo slice 45/89.0]
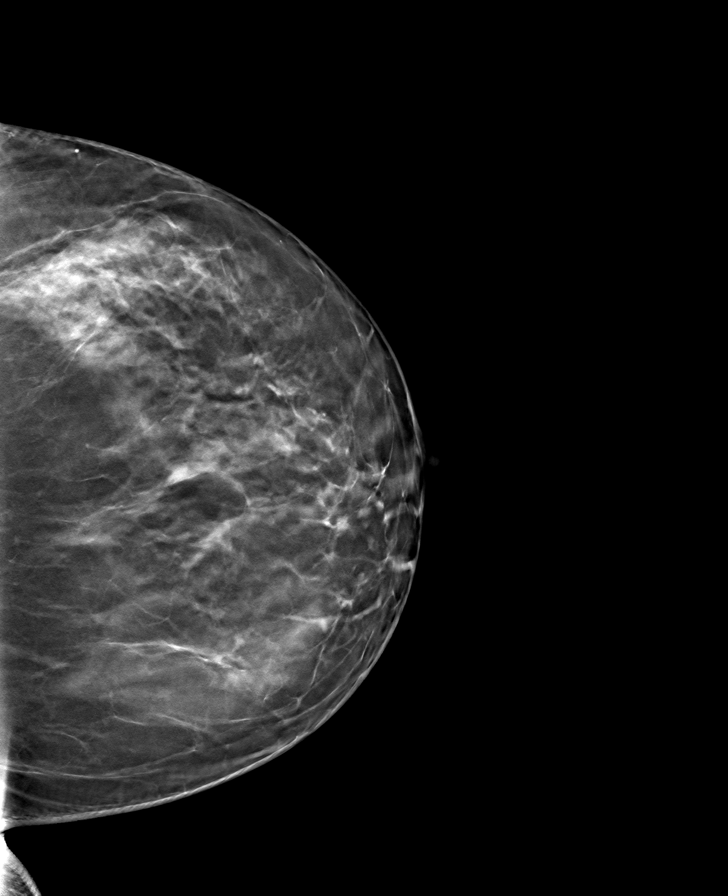

[8 of 24 positions shown; findings below may reference images not displayed]

ACR Breast Density Category b: There are scattered areas of
fibroglandular density.
FINDINGS: There are no findings suspicious for malignancy.
IMPRESSION: No mammographic evidence of malignancy. A result letter of this
screening mammogram will be mailed directly to the patient.

RECOMMENDATION:
Screening mammogram in one year. (Code:[BY])

BI-RADS CATEGORY  1: Negative.

## 2021-01-27 ENCOUNTER — Encounter: Payer: 59 | Admitting: Physical Therapy

## 2021-02-03 ENCOUNTER — Encounter: Payer: Self-pay | Admitting: Physical Therapy

## 2021-02-03 ENCOUNTER — Ambulatory Visit (INDEPENDENT_AMBULATORY_CARE_PROVIDER_SITE_OTHER): Payer: 59 | Admitting: Physical Therapy

## 2021-02-03 ENCOUNTER — Other Ambulatory Visit: Payer: Self-pay

## 2021-02-03 DIAGNOSIS — M25552 Pain in left hip: Secondary | ICD-10-CM | POA: Diagnosis not present

## 2021-02-03 DIAGNOSIS — R2689 Other abnormalities of gait and mobility: Secondary | ICD-10-CM

## 2021-02-03 DIAGNOSIS — M6281 Muscle weakness (generalized): Secondary | ICD-10-CM | POA: Diagnosis not present

## 2021-02-03 DIAGNOSIS — M25652 Stiffness of left hip, not elsewhere classified: Secondary | ICD-10-CM | POA: Diagnosis not present

## 2021-02-03 NOTE — Patient Instructions (Signed)
Access Code: [email or web address] URL: [email or web address] Date: 02/03/2021 Prepared by: Moshe Cipro  Exercises Supine Bridge - 1-2 x daily - 7 x weekly - 2-3 sets - 10 reps - 5 sec hold Clamshell with Resistance - 1-2 x daily - 7 x weekly - 2-3 sets - 10 reps Mini Squat with Counter Support - 1-2 x daily - 7 x weekly - 2-3 sets - 10 reps Kettlebell Deadlift - 1-2 x daily - 7 x weekly - 2-3 sets - 10 reps Standing Single Leg Stance with Unilateral Counter Support - 1-2 x daily - 7 x weekly - 2-3 sets - 3 reps - 10 sec hold Prone on Elbows Stretch - 5-8 x daily - 7 x weekly - 1 sets - 1 reps - 3 min hold Prone Press Up - 5-8 x daily - 7 x weekly - 1 sets - 10 reps - 10 sec hold Standing Lumbar Extension at Wall - Forearms - 5-8 x daily - 7 x weekly - 1 sets - 10 reps - 10 sec hold  Patient Education Trigger Point Dry Needling

## 2021-02-03 NOTE — Therapy (Addendum)
[facility] Physical Therapy [address] [place], [place], [address] Phone: [phone]   Fax:  [phone]  Physical Therapy Treatment /Discharge  Patient Details  Name: Maria Lynch MRN: [id number] Date of Birth: 1980/11/11 Referring Provider (PT): Leandrew Koyanagi, MD   Encounter Date: 02/03/2021   PT End of Session - 02/03/21 1413     Visit Number 3    Number of Visits 6    Date for PT Re-Evaluation 02/16/21    Authorization Type [employer]    PT Start Time 0940    PT Stop Time 1012    PT Time Calculation (min) 32 min    Activity Tolerance Patient tolerated treatment well    Behavior During Therapy [facility] for tasks assessed/performed             Past Medical History:  Diagnosis Date   GERD (gastroesophageal reflux disease)    Prediabetes     Past Surgical History:  Procedure Laterality Date   WISDOM TOOTH EXTRACTION      There were no vitals filed for this visit.   Subjective Assessment - 02/03/21 0945     Subjective still having trouble with stairs    Limitations Walking    How long can you walk comfortably? starts limping, walks 20-30 min most days    Diagnostic tests xrays    Patient Stated Goals improve pain, walk without a limp, go up/down stairs    Currently in Pain? Yes    Pain Score 2     Pain Location Hip    Pain Orientation Left    Pain Descriptors / Indicators Aching;Dull;Cramping    Pain Type Chronic pain    Pain Onset More than a month ago    Pain Frequency Intermittent    Aggravating Factors  walking, stairs, intercourse    Pain Relieving Factors stretches, rest, advil                [facility] PT Assessment - 02/03/21 1004       Assessment   Medical Diagnosis M25.552 (ICD-10-CM) - Pain in left hip  chronic    Referring Provider (PT) Leandrew Koyanagi, MD      Observation/Other Assessments   Focus on Therapeutic Outcomes (FOTO)  57      Strength   Right Hip Flexion 4/5    Right Hip Extension 4/5    Right Hip  External Rotation  3+/5    Right Hip Internal Rotation 4/5    Right Hip ADduction 4/5    Left Hip Flexion 3/5    Left Hip Extension 3/5    Left Hip External Rotation 3/5    Left Hip Internal Rotation 4/5    Left Hip ABduction 3/5    Right Knee Flexion 5/5    Right Knee Extension 5/5    Left Knee Flexion 4/5    Left Knee Extension 5/5    Right Ankle Dorsiflexion 4/5    Right Ankle Plantar Flexion 3/5    Left Ankle Dorsiflexion 2/5    Left Ankle Plantar Flexion 2/5      Slump test   Findings Positive    Side Left                           [facility] Adult PT Treatment/Exercise - 02/03/21 1004       Ambulation/Gait   Gait Comments increased foot slap/foot drop noted with amb, decreased push off noted with gait; unable to  descend stairs without trunk rotation due to weakness      Exercises   Exercises Lumbar      Lumbar Exercises: Stretches   Standing Extension 10 reps;10 seconds    Prone on Elbows Stretch 3 reps;60 seconds   continuous   Press Ups 10 reps;10 seconds                     PT Education - 02/03/21 1413     Education Details HEP    Person(s) Educated Patient    Methods Explanation;Demonstration;Handout    Comprehension Verbalized understanding;Returned demonstration;Need further instruction              PT Short Term Goals - 01/21/21 1513       PT SHORT TERM GOAL #1   Title independent with initial HEP    Time 3    Period Weeks    Status Achieved    Target Date 01/26/21               PT Long Term Goals - 01/21/21 1513       PT LONG TERM GOAL #1   Title independent with final HEP    Time 6    Period Weeks    Status On-going    Target Date 02/16/21      PT LONG TERM GOAL #2   Title FOTO score improved to 70 for improved function    Time 6    Period Weeks    Status On-going    Target Date 02/16/21      PT LONG TERM GOAL #3   Title demonstrate at least 4/5 Lt hip strength for improved function    Time 6     Period Weeks    Status On-going    Target Date 02/16/21      PT LONG TERM GOAL #4   Title negotiate stairs reciprocally without increase in pain for improved function    Time 6    Period Weeks    Status On-going    Target Date 02/16/21      PT LONG TERM GOAL #5   Title report pain < 3/10 with increased activity for improved function    Time 6    Period Weeks    Status On-going    Target Date 02/16/21                   Plan - 02/03/21 1414     Clinical Impression Statement Pt arrived to PT session today with changes noted in gait and observed descending stairs today.  She is unable to descend stairs without rotating trunk due to weakness in bil LEs with Lt worse than Rt.  Reassessment of MMT demonstrating slightly decreased strength in LLE compared to initial eval.  Feel symptoms are coming more from back rather than hip at this time and recommend she return to MD for further assessment.  Will send note to MD today.    Personal Factors and Comorbidities Fitness;Time since onset of injury/illness/exacerbation    Examination-Activity Limitations Locomotion Level;Transfers;Stand;Stairs    Examination-Participation Restrictions Church;Yard Work;Community Activity;Occupation;Driving;Interpersonal Relationship    Stability/Clinical Decision Making Evolving/Moderate complexity    Rehab Potential Good    PT Frequency 1x / week    PT Duration 6 weeks    PT Treatment/Interventions ADLs/Self Care Home Management;Cryotherapy;Electrical Stimulation;Ultrasound;Traction;Moist Heat;Gait training;Stair training;Functional mobility training;Therapeutic activities;Therapeutic exercise;Balance training;Patient/family education;Neuromuscular re-education;Manual techniques;Taping;Dry needling;Passive range of motion    PT Next Visit Plan return to  MD - hold PT    PT Home Exercise Plan Access Code: [email or web address]    Consulted and Agree with Plan of Care Patient             Patient will benefit  from skilled therapeutic intervention in order to improve the following deficits and impairments:  Abnormal gait, Increased fascial restricitons, Pain, Increased muscle spasms, Decreased mobility, Decreased range of motion, Decreased strength, Impaired flexibility, Difficulty walking, Decreased balance  Visit Diagnosis: Pain in left hip  Stiffness of left hip, not elsewhere classified  Muscle weakness (generalized)  Other abnormalities of gait and mobility     Problem List Patient Active Problem List   Diagnosis Date Noted   Hip pain, chronic, left 11/04/2020   Writer's cramp 11/04/2020   Overweight (BMI 25.0-29.9) 11/04/2020   Prediabetes 11/04/2020   Gastroesophageal reflux disease without esophagitis 11/04/2020      Laureen Abrahams, PT, DPT 02/03/21 2:18 PM  PHYSICAL THERAPY DISCHARGE SUMMARY  Visits from Start of Care: 3  Current functional level related to goals / functional outcomes: See note   Remaining deficits: See note   Education / Equipment: HEP   Patient agrees to discharge. Patient goals were not met. Patient is being discharged due to not returning since the last visit. Scot Jun, PT, DPT, OCS, ATC 02/23/21  9:59 AM       [facility] Physical Therapy [address] [place], [place], [address] Phone: [phone]   Fax:  [phone]  Name: Maria Lynch MRN: [id number] Date of Birth: 11-03-1980

## 2021-02-10 ENCOUNTER — Encounter: Payer: 59 | Admitting: Physical Therapy

## 2021-02-12 ENCOUNTER — Other Ambulatory Visit: Payer: Self-pay

## 2021-02-12 ENCOUNTER — Ambulatory Visit (INDEPENDENT_AMBULATORY_CARE_PROVIDER_SITE_OTHER): Payer: 59 | Admitting: Orthopaedic Surgery

## 2021-02-12 ENCOUNTER — Encounter: Payer: Self-pay | Admitting: Orthopaedic Surgery

## 2021-02-12 DIAGNOSIS — M25552 Pain in left hip: Secondary | ICD-10-CM

## 2021-02-12 NOTE — Progress Notes (Signed)
Office Visit Note   Patient: Maria Lynch           Date of Birth: 02/21/1981           MRN: [id number] Visit Date: 02/12/2021              Requested by: Marcine Matar, MD [address] [place],  [place] [address] PCP: Marcine Matar, MD   Assessment & Plan: Visit Diagnoses:  1. Pain in left hip     Plan: Impression is chronic left hip pain with concerns for lumbar pathology.  I believe she was experiencing some trochanteric bursitis which was alleviated following the injection.  She continues to have worsening left lower extremity weakness despite prescription medications and several months of physical therapy.  I would like to go ahead and order an MRI of the lumbar spine to further evaluate for structural abnormalities.  She will follow-up with [place] once this is been completed.  She will call with concerns or questions in the meantime.  Follow-Up Instructions: Return for f/u after MRI lumbar spine.   Orders:  No orders of the defined types were placed in this encounter.  No orders of the defined types were placed in this encounter.     Procedures: No procedures performed   Clinical Data: No additional findings.   Subjective: Chief Complaint  Patient presents with   Left Hip - Pain   Lower Back - Pain     HPI patient is a pleasant 40 year old female who comes in today for repeat evaluation of chronic left hip pain.  She was seen in our office about 3 months ago for this.  She had been complaining of left lateral hip pain with occasional paresthesias to the lateral aspect of the hip.  At that time, she was also complaining of cramping to the left lower extremity.  It was thought that her symptoms were coming from left hip trochanteric bursitis as well as referred pain from her lumbar spine.  Trochanteric bursa injection was performed and she was sent to physical therapy.  She notes that the lateral hip pain improved but she has had continued weakness to the left  lower extremity.  She feels as though this has gradually worsened.  She denies any bowel or bladder change.  No saddle paresthesias.  She has not previously had an epidural steroid injection or MRI of the lumbar spine.  Review of Systems as detailed in HPI.  All others reviewed and negative.   Objective: Vital Signs: There were no vitals taken for this visit.  Physical Exam well-developed well-nourished female in no acute distress.  Alert oriented x3.  Ortho Exam left hip exam is unremarkable.  Negative straight leg raise.  4-1/2 out of 5 strength with resisted hip flexion/extension, knee flexion and extension and dorsiflexion and plantarflexion.  She has very slight weakness with dorsiflexion but no actual foot drop.  She is neurovascular intact distally.  Specialty Comments:  No specialty comments available.  Imaging: No new imaging   PMFS History: Patient Active Problem List   Diagnosis Date Noted   Hip pain, chronic, left 11/04/2020   Writer's cramp 11/04/2020   Overweight (BMI 25.0-29.9) 11/04/2020   Prediabetes 11/04/2020   Gastroesophageal reflux disease without esophagitis 11/04/2020   Past Medical History:  Diagnosis Date   GERD (gastroesophageal reflux disease)    Prediabetes     Family History  Problem Relation Age of Onset   Ovarian cancer Maternal Grandmother  Diabetes Maternal Grandfather    Heart disease Paternal Grandmother     Past Surgical History:  Procedure Laterality Date   WISDOM TOOTH EXTRACTION     Social History   Occupational History   Occupation: minister  Tobacco Use   Smoking status: Never   Smokeless tobacco: Never  Vaping Use   Vaping Use: Never used  Substance and Sexual Activity   Alcohol use: Yes    Alcohol/week: 8.0 - 10.0 standard drinks    Types: 8 - 10 Cans of beer per week   Drug use: Yes    Comment: edible marijuana   Sexual activity: Not on file

## 2021-02-13 ENCOUNTER — Other Ambulatory Visit: Payer: Self-pay

## 2021-02-13 DIAGNOSIS — M545 Low back pain, unspecified: Secondary | ICD-10-CM

## 2021-02-17 ENCOUNTER — Encounter: Payer: 59 | Admitting: Physical Therapy

## 2021-02-24 ENCOUNTER — Encounter: Payer: 59 | Admitting: Physical Therapy

## 2021-03-03 ENCOUNTER — Other Ambulatory Visit: Payer: Self-pay

## 2021-03-03 ENCOUNTER — Ambulatory Visit
Admission: RE | Admit: 2021-03-03 | Discharge: 2021-03-03 | Disposition: A | Discharge status: Home / Self Care | Payer: 59 | Source: Ambulatory Visit | Attending: Orthopaedic Surgery | Admitting: Orthopaedic Surgery

## 2021-03-03 DIAGNOSIS — M545 Low back pain, unspecified: Secondary | ICD-10-CM

## 2021-03-03 IMAGING — MR MR LUMBAR SPINE W/O CM
4 of 5 series · 26 of 48 positions shown · non-contrast
Comparison: No prior MRI, correlation is made with lumbar spine
radiographs [DATE]

CLINICAL DATA: Low back pain, radiating to left greater than right
leg

EXAM:
MRI LUMBAR SPINE WITHOUT CONTRAST
TECHNIQUE: Multiplanar, multisequence MR imaging of the lumbar spine was
performed. No intravenous contrast was administered.

[Series 2: T2 · sagittal · 4.0mm · 0.53mm/px · 6 of 15 slices shown (1 of 2)]
[im 1/15]
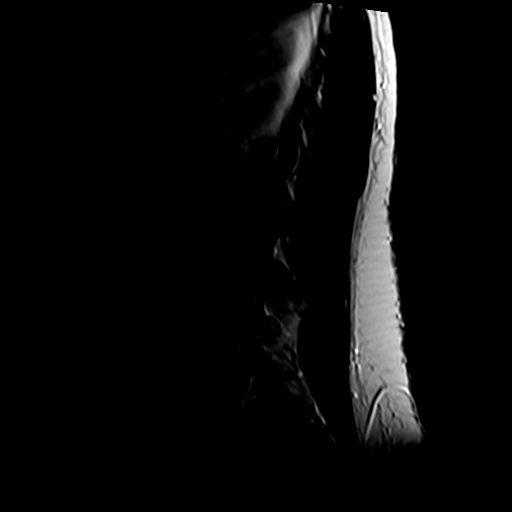
[im 3/15]
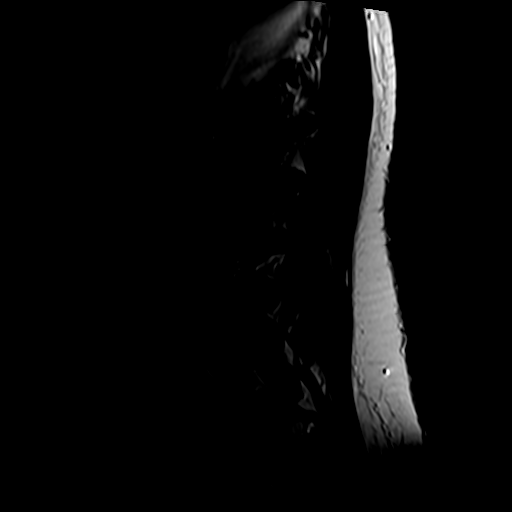
[im 6/15]
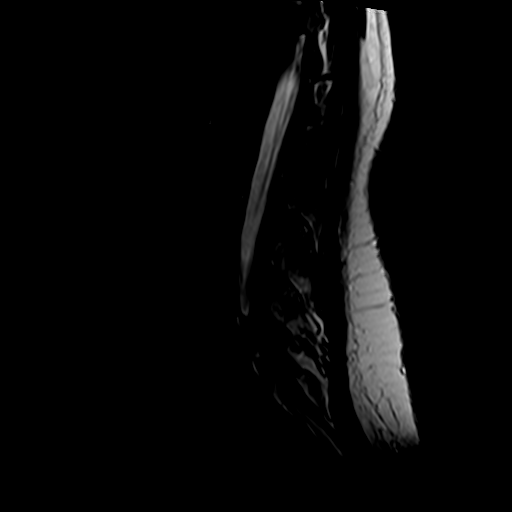
[im 9/15]
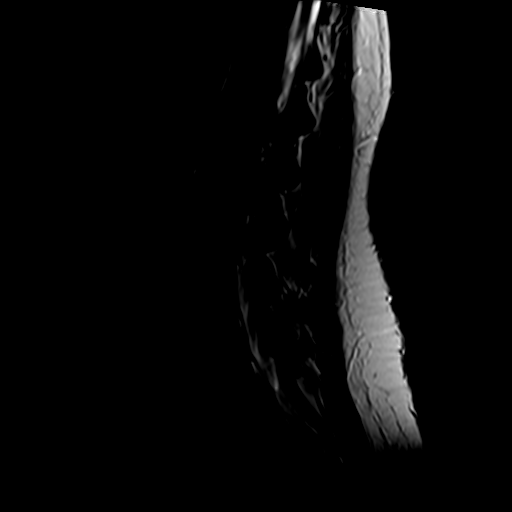
[im 12/15]
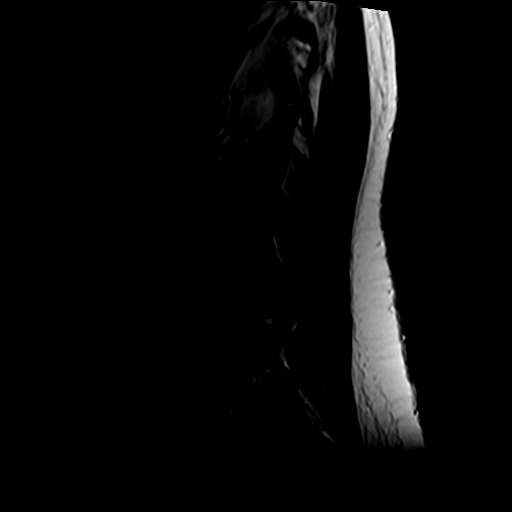
[im 15/15]
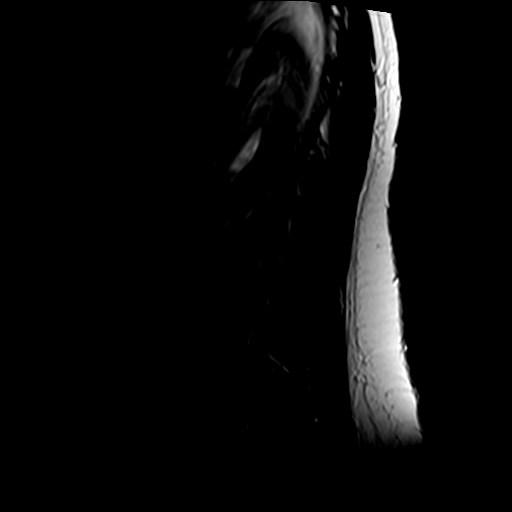

[Series 4: T1 · sagittal · 4.0mm · 0.53mm/px · 6 of 15 slices shown (1 of 2)]
[im 1/15]
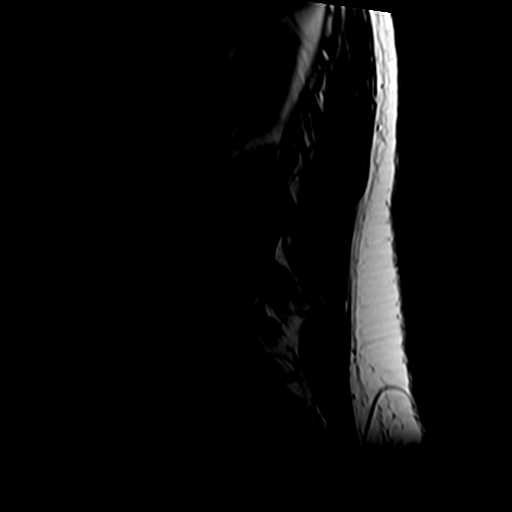
[im 3/15]
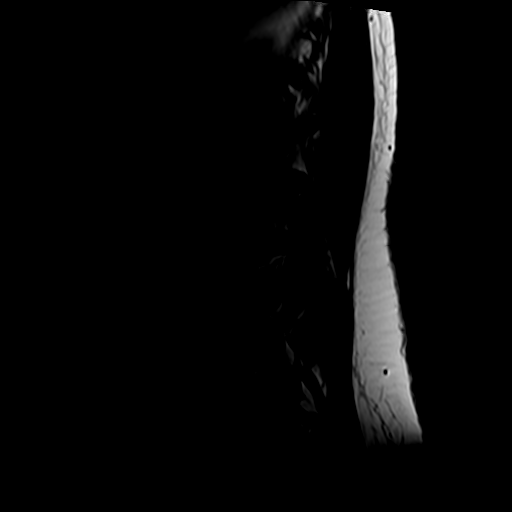
[im 6/15]
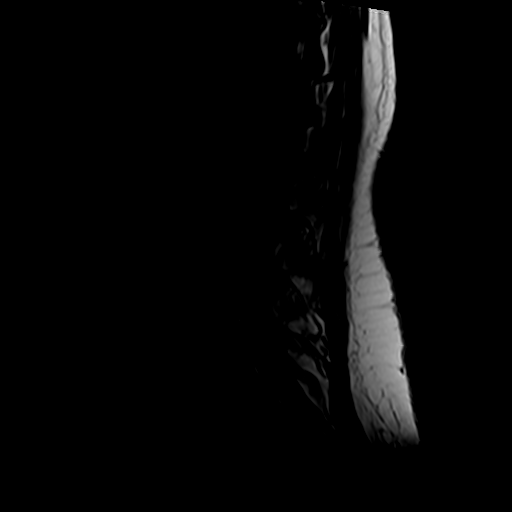
[im 9/15]
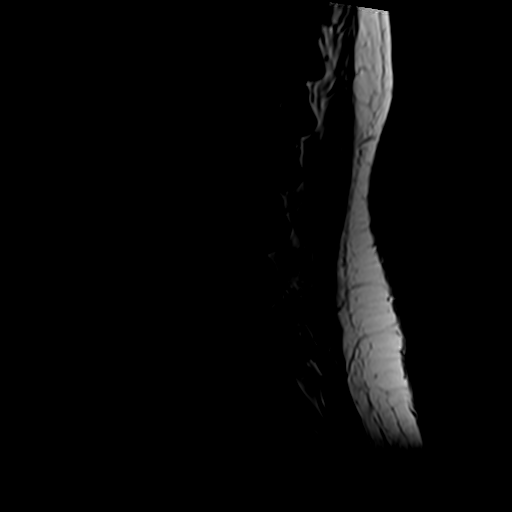
[im 12/15]
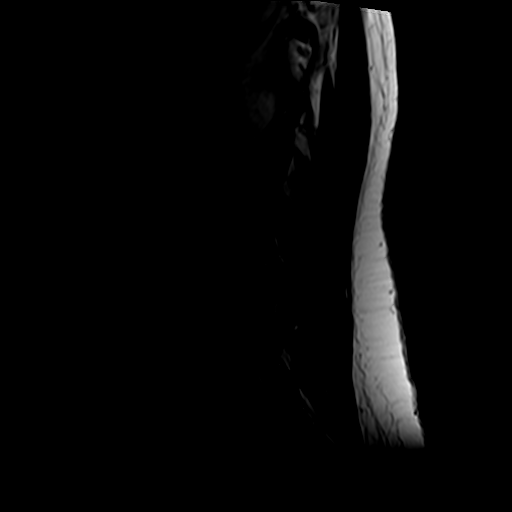
[im 15/15]
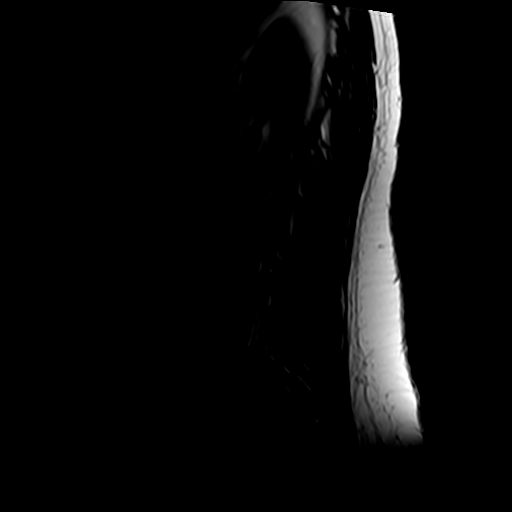

[Series 5: T2 · axial · 4.0mm · 0.70mm/px · z∈[-84,+123]mm · 9 of 39 slices shown (2 of 2)]
[im 1/39]
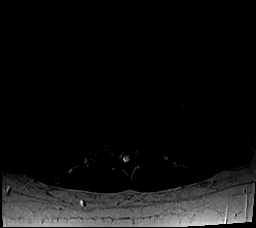
[im 6/39]
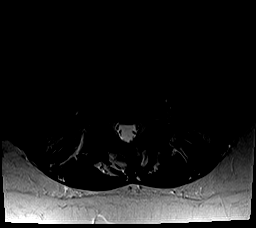
[im 11/39]
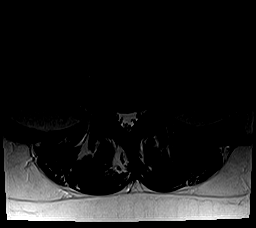
[im 17/39]
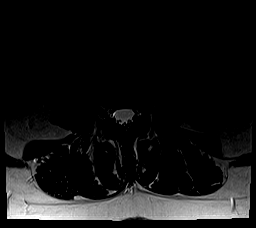
[im 20/39]
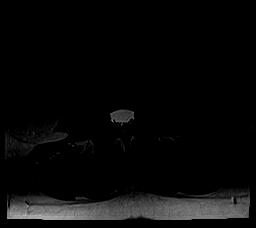
[im 22/39]
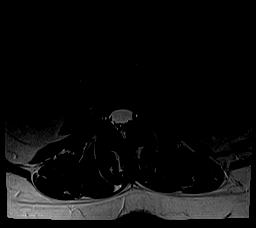
[im 28/39]
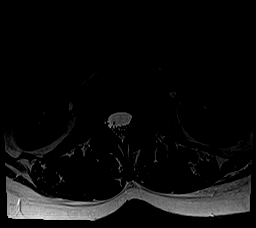
[im 33/39]
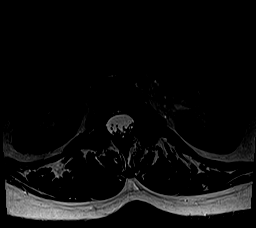
[im 39/39]
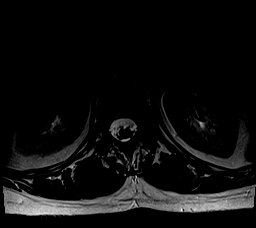

[Series 6: T1 · axial · 4.0mm · 0.35mm/px · z∈[-84,+95]mm · 5 of 39 slices shown (2 of 2)]
[im 1/39]
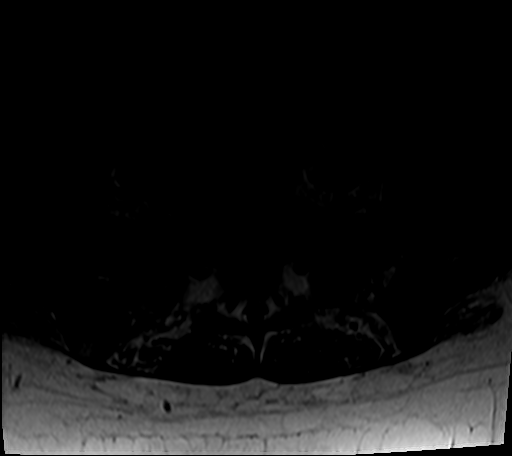
[im 6/39]
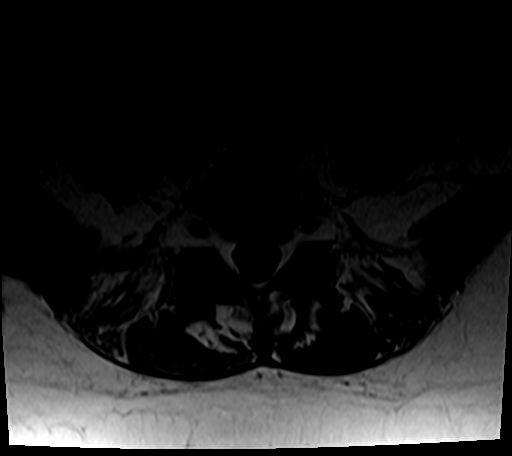
[im 11/39]
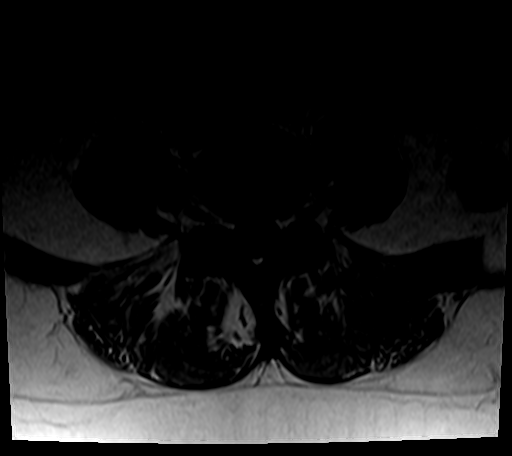
[im 20/39]
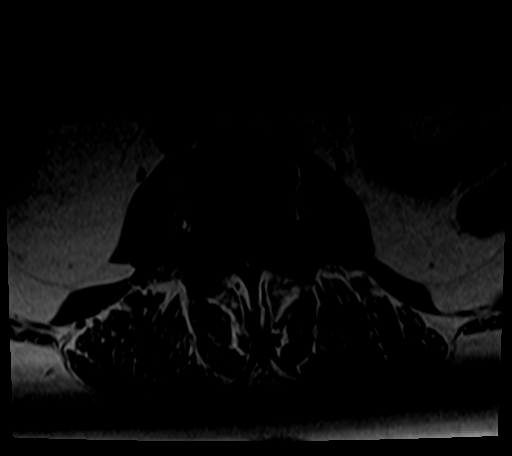
[im 33/39]
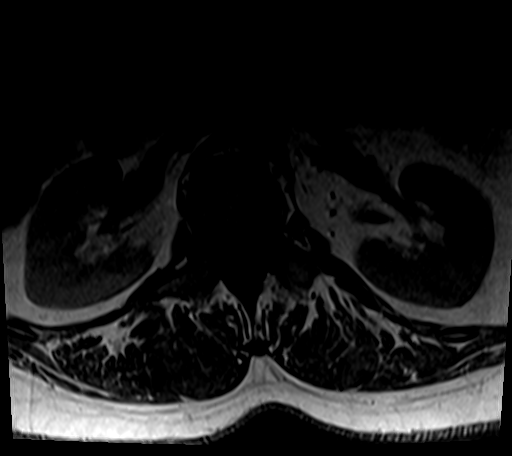

[26 of 48 positions shown; findings below may reference images not displayed]

FINDINGS: Segmentation:  Standard.

Alignment: S shaped curvature of the thoracolumbar spine. No
significant listhesis.

Vertebrae:  No fracture, evidence of discitis, or bone lesion.

Conus medullaris and cauda equina: Conus extends to the L1 level.
Conus and cauda equina appear normal.

Paraspinal and other soft tissues: Negative.  Mild facet

Disc levels:

T12-L1: No significant disc bulge. No spinal canal stenosis or
neural foraminal narrowing.

L1-L2: No significant disc bulge. Mild facet arthropathy. No spinal
canal stenosis or neural foraminal narrowing.

L2-L3: No significant disc bulge. Mild facet arthropathy. No spinal
canal stenosis or neural foraminal narrowing.

L3-L4: No significant disc bulge. Mild facet arthropathy. No spinal
canal stenosis or neural foraminal narrowing.

L4-L5: Disc desiccation and disc height loss with mild disc bulge
with superimposed central protrusion with annular fissure. Mild
facet arthropathy. No spinal canal stenosis or neural foraminal
narrowing.

L5-S1: Disc desiccation and disc height loss with minimal disc
bulge. Mild facet arthropathy. No spinal canal stenosis or neural
foraminal narrowing.
IMPRESSION: Mild degenerative changes without spinal canal stenosis or neural
foraminal narrowing.

## 2021-03-06 ENCOUNTER — Other Ambulatory Visit: Payer: Self-pay

## 2021-03-06 ENCOUNTER — Ambulatory Visit: Payer: 59 | Attending: Internal Medicine | Admitting: Internal Medicine

## 2021-03-06 VITALS — BP 123/85 | HR 90 | Resp 16

## 2021-03-06 DIAGNOSIS — R030 Elevated blood-pressure reading, without diagnosis of hypertension: Secondary | ICD-10-CM

## 2021-03-06 DIAGNOSIS — M5416 Radiculopathy, lumbar region: Secondary | ICD-10-CM

## 2021-03-06 MED ORDER — MELOXICAM 15 MG PO TABS: 15.0000 mg | ORAL_TABLET | Freq: Every day | ORAL | 2 refills | Status: DC

## 2021-03-06 MED ORDER — CYCLOBENZAPRINE HCL 5 MG PO TABS: 5.0000 mg | ORAL_TABLET | Freq: Every day | ORAL | 1 refills | Status: DC | PRN

## 2021-03-06 NOTE — Progress Notes (Signed)
Patient ID: Maria Lynch, female    DOB: 06-05-1980  MRN: [id number]  CC: Back Pain   Subjective: Maria Lynch is a 40 y.o. female who presents for chronic ds management Her concerns today include:  GERD, PreDM, fibrocystic breast ds, HL  Patient was last seen in June of this year.  I informed her that I did get her records from her previous physician Dr. Katherina Mires and diagnosis included prediabetes, fibrocystic breast disease and hyperlipidemia with LDL cholesterol of 143 and A1c of 5.9.  I went over lab test that we did on her visit with me in June. No longer preDM and LDL improved to 110.  She was referred to orthopedics for chronic pain in the left hip.  Saw Dr. Roda Shutters and had x-rays of the hip and the lumbar spine.  However I am unable to see the report from those imaging studies.  She was referred for some physical therapy but reports symptoms did not improve.  Orthopedics thinks that she may be having some lumbar radiculopathy.  MRI of the lumbar spine done 3 days ago revealed some arthritis changes, degenerative disc at L4-L5 and L5-S1.  She also has central disc protrusion at the L4-L5 level. She reports having discomfort across the lower back which she describes as a tightness more so than pain.  It radiates to the hips especially the left side.  She does yoga which helps.  She also uses some ibuprofen at times.    Elev BP: Diastolic blood pressure noted to be elevated on last visit.  DASH diet was encouraged.  Doing okay with limiting salt.  No personal device to check BP but her partner has 1 that she can use.  HM:  Had flu shot 02/04/21 at [facility] on [address], COVID booster #2 on 01/23/2021 Pfizer 08/02/19, 08/22/2019, booster was sometime 03/2020 (dose not have date for this one)  Patient Active Problem List   Diagnosis Date Noted   Hip pain, chronic, left 11/04/2020   Writer's cramp 11/04/2020   Overweight (BMI 25.0-29.9) 11/04/2020   Prediabetes 11/04/2020    Gastroesophageal reflux disease without esophagitis 11/04/2020     Current Outpatient Medications on File Prior to Visit  Medication Sig Dispense Refill   pantoprazole (PROTONIX) 40 MG tablet Take 1 tablet (40 mg total) by mouth daily. (Patient not taking: Reported on 01/05/2021) 30 tablet 4   No current facility-administered medications on file prior to visit.    No Known Allergies  Social History   Socioeconomic History   Marital status: Media planner    Spouse name: Not on file   Number of children: 0   Years of education: Not on file   Highest education level: Doctorate  Occupational History   Occupation: minister  Tobacco Use   Smoking status: Never   Smokeless tobacco: Never  Vaping Use   Vaping Use: Never used  Substance and Sexual Activity   Alcohol use: Yes    Alcohol/week: 8.0 - 10.0 standard drinks    Types: 8 - 10 Cans of beer per week   Drug use: Yes    Comment: edible marijuana   Sexual activity: Not on file  Other Topics Concern   Not on file  Social History Narrative   Not on file   Social Determinants of Health   Financial Resource Strain: Not on file  Food Insecurity: Not on file  Transportation Needs: Not on file  Physical Activity: Not on file  Stress: Not on file  Social Connections: Not on file  Intimate Partner Violence: Not on file    Family History  Problem Relation Age of Onset   Ovarian cancer Maternal Grandmother    Diabetes Maternal Grandfather    Heart disease Paternal Grandmother     Past Surgical History:  Procedure Laterality Date   WISDOM TOOTH EXTRACTION      ROS: Review of Systems Negative except as stated above  PHYSICAL EXAM: BP 123/85   Pulse 90   Resp 16   SpO2 96%   Wt Readings from Last 3 Encounters:  11/13/20 167 lb (75.8 kg)  11/04/20 167 lb 6.4 oz (75.9 kg)  Repeat blood pressure 128/88  Physical Exam  General appearance - alert, well appearing, and in no distress Mental status - normal mood,  behavior, speech, dress, motor activity, and thought processes Chest - clear to auscultation, no wheezes, rales or rhonchi, symmetric air entry Heart - normal rate, regular rhythm, normal S1, S2, no murmurs, rubs, clicks or gallops Musculoskeletal - no tenderness on palpation of the lumbar spine or surrounding paraspinal muscle.  Gait is stable    CMP Latest Ref Rng & Units 11/04/2020  Glucose 65 - 99 mg/dL 89  BUN 6 - 24 mg/dL 12  Creatinine 1.75 - 1.02 mg/dL 5.85  Sodium 277 - 824 mmol/L 138  Potassium 3.5 - 5.2 mmol/L 4.7  Chloride 96 - 106 mmol/L 101  CO2 20 - 29 mmol/L 20  Calcium 8.7 - 10.2 mg/dL 9.9  Total Protein 6.0 - 8.5 g/dL 7.2  Total Bilirubin 0.0 - 1.2 mg/dL 0.3  Alkaline Phos 44 - 121 IU/L 63  AST 0 - 40 IU/L 16  ALT 0 - 32 IU/L 20   Lipid Panel     Component Value Date/Time   CHOL 186 11/04/2020 0957   TRIG 121 11/04/2020 0957   HDL 54 11/04/2020 0957   CHOLHDL 3.4 11/04/2020 0957   LDLCALC 110 (H) 11/04/2020 0957    CBC    Component Value Date/Time   WBC 9.0 11/04/2020 0957   RBC 4.53 11/04/2020 0957   HGB 13.9 11/04/2020 0957   HCT 41.4 11/04/2020 0957   PLT 324 11/04/2020 0957   MCV 91 11/04/2020 0957   MCH 30.7 11/04/2020 0957   MCHC 33.6 11/04/2020 0957   RDW 12.5 11/04/2020 0957    ASSESSMENT AND PLAN: 1. Chronic lumbar radiculopathy I went over the results of the MRI report with her.  I recommend she calls Dr. Warren Danes office to let them know that she has had MRI so that they can get her in to discuss management options based on the results.  Most likely they would recommend injections.  In the meantime I have put her on meloxicam which should be easier on the stomach than ibuprofen as an anti-inflammatory.  I have also placed her on low-dose of Flexeril to help decrease the muscle tightness.  Advised that the medication can cause drowsiness.  I also recommend using a heating pad to the back as needed. - meloxicam (MOBIC) 15 MG tablet; Take 1 tablet  (15 mg total) by mouth daily.  Dispense: 30 tablet; Refill: 2 - cyclobenzaprine (FLEXERIL) 5 MG tablet; Take 1 tablet (5 mg total) by mouth daily as needed for muscle spasms.  Dispense: 30 tablet; Refill: 1  2. Elevated blood pressure reading Continue low-salt diet.  Since she does have access to her home blood pressure device, I request that she checks her blood pressure at least once a  week and after about 4 readings she can send me the results via MyChart.  Normal blood pressure is 120/80 or lower.   Patient was given the opportunity to ask questions.  Patient verbalized understanding of the plan and was able to repeat key elements of the plan.   No orders of the defined types were placed in this encounter.    Requested Prescriptions   Signed Prescriptions Disp Refills   meloxicam (MOBIC) 15 MG tablet 30 tablet 2    Sig: Take 1 tablet (15 mg total) by mouth daily.   cyclobenzaprine (FLEXERIL) 5 MG tablet 30 tablet 1    Sig: Take 1 tablet (5 mg total) by mouth daily as needed for muscle spasms.    Return in about 6 months (around 09/04/2021).  Jonah Blue, MD, FACP

## 2021-03-06 NOTE — Patient Instructions (Addendum)
Prescription sent to your pharmacy for the muscle relaxant called cyclobenzaprine and the anti-inflammatory medication called meloxicam.  The muscle relaxant can cause some drowsiness.  You may also benefit from using a heating pad to the lower back.  Try to check your blood pressure at least once a week.  Normal blood pressure is 120/80 or lower.  You can send me your results via MyChart after about 4-5 readings.  Please remember to get your tetanus vaccine.

## 2021-03-10 ENCOUNTER — Other Ambulatory Visit: Payer: Self-pay

## 2021-03-10 ENCOUNTER — Ambulatory Visit (INDEPENDENT_AMBULATORY_CARE_PROVIDER_SITE_OTHER): Payer: 59 | Admitting: Orthopaedic Surgery

## 2021-03-10 DIAGNOSIS — M5416 Radiculopathy, lumbar region: Secondary | ICD-10-CM | POA: Diagnosis not present

## 2021-03-10 NOTE — Progress Notes (Signed)
   Office Visit Note   Patient: Maria Lynch           Date of Birth: 1980/11/28           MRN: [id number] Visit Date: 03/10/2021              Requested by: Marcine Matar, MD [address] [place],  [place] [address] PCP: Marcine Matar, MD   Assessment & Plan: Visit Diagnoses:  1. Chronic lumbar radiculopathy     Plan: Impression is chronic left lateral hip pain and left lower extremity weakness with evidence of mild degenerative changes throughout the lumbar spine on recent MRI.  At this point, she has tried several months of physical therapy, NSAIDs without relief of symptoms.  I would like to refer her to Dr. Alvester Morin for possible ESI versus facet block.  She will follow-up with [place] as needed.  Call with concerns or questions.  Follow-Up Instructions: Return if symptoms worsen or fail to improve.   Orders:  No orders of the defined types were placed in this encounter.  No orders of the defined types were placed in this encounter.     Procedures: No procedures performed   Clinical Data: No additional findings.   Subjective: Chief Complaint  Patient presents with   Other     Scan review    HPI patient is a pleasant 40 year old female who comes in today to discuss lumbar spine MRI.  She has been dealing with chronic left hip and lower extremity weakness despite trochanteric bursa injection and several months of physical therapy.  Subsequent MRI of the lumbar spine ordered which shows mild degenerative changes throughout the lumbar spine.     Objective: Vital Signs: There were no vitals taken for this visit.    Ortho Exam stable lumbar exam  Specialty Comments:  No specialty comments available.  Imaging: No new imaging   PMFS History: 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   Overweight (BMI 25.0-29.9) 11/04/2020    Prediabetes 11/04/2020   Gastroesophageal reflux disease without esophagitis 11/04/2020   Past Medical History:  Diagnosis Date   GERD (gastroesophageal reflux disease)    Prediabetes     Family History  Problem Relation Age of Onset   Ovarian cancer Maternal Grandmother    Diabetes Maternal Grandfather    Heart disease Paternal Grandmother     Past Surgical History:  Procedure Laterality Date   WISDOM TOOTH EXTRACTION     Social History   Occupational History   Occupation: Optician, dispensing  Tobacco Use   Smoking status: Never   Smokeless tobacco: Never  Vaping Use   Vaping Use: Never used  Substance and Sexual Activity   Alcohol use: Yes    Alcohol/week: 8.0 - 10.0 standard drinks    Types: 8 - 10 Cans of beer per week   Drug use: Yes    Comment: edible marijuana   Sexual activity: Not on file

## 2021-03-11 ENCOUNTER — Other Ambulatory Visit: Payer: Self-pay

## 2021-03-11 DIAGNOSIS — M545 Low back pain, unspecified: Secondary | ICD-10-CM

## 2021-03-13 ENCOUNTER — Telehealth: Payer: Self-pay | Admitting: Physical Medicine and Rehabilitation

## 2021-03-13 NOTE — Telephone Encounter (Signed)
Pt returned call to [facility] concerning a referral appt. Please call pt at [phone].

## 2021-03-16 ENCOUNTER — Telehealth: Payer: Self-pay | Admitting: Physical Medicine and Rehabilitation

## 2021-03-16 NOTE — Telephone Encounter (Signed)
See referral

## 2021-03-16 NOTE — Telephone Encounter (Signed)
Pt returning call to [place]. Please call pt at [phone].

## 2021-03-17 NOTE — Telephone Encounter (Signed)
Pt called back to get scheduled with Dr.Newton. Please call back.   She states next week she could do anytime on wed nov 9 or Thursday nov 10 before noon.  CB [phone]

## 2021-03-26 ENCOUNTER — Other Ambulatory Visit: Payer: Self-pay

## 2021-03-26 ENCOUNTER — Encounter: Payer: Self-pay | Admitting: Physical Medicine and Rehabilitation

## 2021-03-26 ENCOUNTER — Ambulatory Visit (INDEPENDENT_AMBULATORY_CARE_PROVIDER_SITE_OTHER): Payer: 59 | Admitting: Physical Medicine and Rehabilitation

## 2021-03-26 VITALS — BP 148/89 | HR 87

## 2021-03-26 DIAGNOSIS — M7062 Trochanteric bursitis, left hip: Secondary | ICD-10-CM | POA: Diagnosis not present

## 2021-03-26 DIAGNOSIS — R269 Unspecified abnormalities of gait and mobility: Secondary | ICD-10-CM | POA: Diagnosis not present

## 2021-03-26 DIAGNOSIS — R2689 Other abnormalities of gait and mobility: Secondary | ICD-10-CM | POA: Diagnosis not present

## 2021-03-26 NOTE — Patient Instructions (Signed)

## 2021-03-26 NOTE — Progress Notes (Signed)
Pt state lower back pain that travels down both hips and legs.Pt state she has weakness in her legs. Pt state walking, climbing stairs and getting out of bed in the morning makes the pain worse. Pt state she takes pain meds and do yoga to help ease her pain.  Numeric Pain Rating Scale and Functional Assessment Average Pain 6 Pain Right Now 2 My pain is intermittent, dull, tingling, and aching Pain is worse with: walking, standing, some activites, and laying down Pain improves with: medication and injections   In the last MONTH (on 0-10 scale) has pain interfered with the following?  1. General activity like being  able to carry out your everyday physical activities such as walking, climbing stairs, carrying groceries, or moving a chair?  Rating(5)  2. Relation with others like being able to carry out your usual social activities and roles such as  activities at home, at work and in your community. Rating(6)  3. Enjoyment of life such that you have  been bothered by emotional problems such as feeling anxious, depressed or irritable?  Rating(7)

## 2021-03-26 NOTE — Progress Notes (Signed)
Maria Lynch - 40 y.o. female MRN [id number]  Date of birth: [birth year]  Office Visit Note: Visit Date: 03/26/2021 PCP: Marcine Matar, MD Referred by: Marcine Matar, MD  Subjective: Chief Complaint  Patient presents with   Lower Back - Pain   Right Hip - Pain   Left Hip - Pain   Left Leg - Pain   Right Leg - Pain   HPI: Maria Lynch is a 40 y.o. female who comes in today per the request of Dr. Glee Arvin for evaluation of chronic, worsening and severe left sided lateral hip pain. Patient reports pain has been ongoing for several months. Patient reports pain is exacerbated by prolonged walking and climbing stairs. Patient describes pain as soreness and aching sensation, currently rates as 2 out of 10. Patient reports some relief of pain with home exercise regimen, YOGA, swimming and OTC medications as needed.  Patient recently completed physical therapy with our in house team and reports these treatments did help to alleviate pain, however pain relief was very short lived. Patient's recent lumbar MRI exhibits mild multi-level facet arthropathy, central disc protrusion at L4-L5 and disc desiccation and disc height loss with minimal disc bulge at L5-S1. No high grade spinal canal stenosis noted. Patient was recently treated by Dr. Glee Arvin whom did order bilateral hip x-rays that were negative for degenerative changes/acute findings. Patient also received left greater trochanteric bursa injection by Dr. Roda Shutters in June which she reports helped signicantly with her pain. Patient also mentioned during our visit that she is having balance and gait issues, which she is unsure if these symptoms are related to her left hip pain. Patient reports balance and gait issues ongoing for several months. Patient states it is difficult for her to walk up stairs and reports her legs feel heavy and weak. Patient states she feels like balance is off and could fall easily. Patient has not been formally evaluated  by neurology for these issues and is requesting a recommendation and referral from our office. Patient denies numbness and tingling. Patient denies recent trauma or falls.   16% Oswestry Disability Index Score: 0 to 10 (20%) minimal disability: The patient can cope with most living activities. Usually no treatment is indicated apart from advice on lifting sitting and exercise.  Review of Systems  Musculoskeletal:  Positive for joint pain.  Neurological:  Positive for weakness. Negative for tingling, sensory change and focal weakness.       Pt reports generalized weakness to bilateral lower extremities, also reports balance and gait issues.   All other systems reviewed and are negative. Otherwise per HPI.  Assessment & Plan: Visit Diagnoses:    ICD-10-CM   1. Greater trochanteric bursitis, left  M70.62     2. Balance problem  R26.89 Ambulatory referral to Neurology    3. Gait disturbance  R26.9 Ambulatory referral to Neurology       Plan: Findings:  Chronic, worsening and severe left sided lateral hip pain. Patient continues to have excruciating and debilitating pain despite good conservative therapies such as formal physical therapy, home exercise regimen, and OTC medications as needed. Patient's clinical presentation and exam are consistent with greater trochanteric bursitis as she does have tenderness to left greater trochanter upon exam. We feel the next step is to perform a diagnostic and hopefully therapeutic left greater trochanteric bursa injection under fluoroscopic guidance.  She could be having some back pain in general based on the lumbar spine findings but its fairly  mild with central protrusion which is noncompressive with annular fissure which is common.  She is not just having focal back pain but it is into that left lateral hip area.  She has no nerve compression etc.    We did speak with patient in great detail about her current condition and it seems her most significant  concerns at this time are the balance and gait issues.  I am skeptical that her left lateral hip pain is contributing to her balance and gait issues and therefore feel like she needs to be evaluated by a neurologist to at least look for any condition that may be causing this. We did place a referral today for Lurena Joiner Tat at [facility] as she specializes in movement disorders. Patient voices that she would like to hold off on left greater trochanter bursa injection at this time and will let us know if her pain becomes severe in nature. No red flag symptoms noted upon exam today.    Meds & Orders: No orders of the defined types were placed in this encounter.   Orders Placed This Encounter  Procedures   Ambulatory referral to Neurology    Follow-up: Return if symptoms worsen or fail to improve.   Procedures: No procedures performed      Clinical History: MRI LUMBAR SPINE WITHOUT CONTRAST   TECHNIQUE: Multiplanar, multisequence MR imaging of the lumbar spine was performed. No intravenous contrast was administered.   COMPARISON:  No prior MRI, correlation is made with lumbar spine radiographs 11/13/2020   FINDINGS: Segmentation:  Standard.   Alignment: S shaped curvature of the thoracolumbar spine. No significant listhesis.   Vertebrae:  No fracture, evidence of discitis, or bone lesion.   Conus medullaris and cauda equina: Conus extends to the L1 level. Conus and cauda equina appear normal.   Paraspinal and other soft tissues: Negative.  Mild facet   Disc levels:   T12-L1: No significant disc bulge. No spinal canal stenosis or neural foraminal narrowing.   L1-L2: No significant disc bulge. Mild facet arthropathy. No spinal canal stenosis or neural foraminal narrowing.   L2-L3: No significant disc bulge. Mild facet arthropathy. No spinal canal stenosis or neural foraminal narrowing.   L3-L4: No significant disc bulge. Mild facet arthropathy. No spinal canal stenosis  or neural foraminal narrowing.   L4-L5: Disc desiccation and disc height loss with mild disc bulge with superimposed central protrusion with annular fissure. Mild facet arthropathy. No spinal canal stenosis or neural foraminal narrowing.   L5-S1: Disc desiccation and disc height loss with minimal disc bulge. Mild facet arthropathy. No spinal canal stenosis or neural foraminal narrowing.   IMPRESSION: Mild degenerative changes without spinal canal stenosis or neural foraminal narrowing.     Electronically Signed   By: Wiliam Ke M.D.   On: 03/03/2021 21:44   She reports that she has never smoked. She has never used smokeless tobacco.  Recent Labs    11/04/20 0957  HGBA1C 5.6    Objective:  VS:  HT:    WT:   BMI:     BP: (!) 148/89  HR:87bpm  TEMP: ( )  RESP:  Physical Exam Vitals and nursing note reviewed.  HENT:     Head: Normocephalic and atraumatic.     Right Ear: External ear normal.     Left Ear: External ear normal.     Nose: Nose normal.     Mouth/Throat:     Mouth: Mucous membranes are moist.  Eyes:  Extraocular Movements: Extraocular movements intact.  Cardiovascular:     Rate and Rhythm: Normal rate.     Pulses: Normal pulses.  Pulmonary:     Effort: Pulmonary effort is normal.  Abdominal:     General: Abdomen is flat. There is no distension.  Musculoskeletal:        General: Tenderness present.     Cervical back: Normal range of motion.     Comments: Pt rises from seated position to standing without difficulty. Good lumbar range of motion. Strong distal strength without clonus, pain noted upon palpation of left greater trochanter. Sensation intact bilaterally. Walks independently, gait is slow and unsteady.  Skin:    General: Skin is warm and dry.     Capillary Refill: Capillary refill takes less than 2 seconds.  Neurological:     Mental Status: She is alert.     Gait: Gait abnormal.  Psychiatric:        Mood and Affect: Mood normal.     Ortho Exam  Imaging: No results found.  Past Medical/Family/Surgical/Social History: Medications & Allergies reviewed per EMR, new medications updated. 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   Overweight (BMI 25.0-29.9) 11/04/2020   Prediabetes 11/04/2020   Gastroesophageal reflux disease without esophagitis 11/04/2020   Past Medical History:  Diagnosis Date   GERD (gastroesophageal reflux disease)    Prediabetes    Family History  Problem Relation Age of Onset   Ovarian cancer Maternal Grandmother    Diabetes Maternal Grandfather    Heart disease Paternal Grandmother    Past Surgical History:  Procedure Laterality Date   WISDOM TOOTH EXTRACTION     Social History   Occupational History   Occupation: Optician, dispensing  Tobacco Use   Smoking status: Never   Smokeless tobacco: Never  Vaping Use   Vaping Use: Never used  Substance and Sexual Activity   Alcohol use: Yes    Alcohol/week: 8.0 - 10.0 standard drinks    Types: 8 - 10 Cans of beer per week   Drug use: Yes    Comment: edible marijuana   Sexual activity: Not on file

## 2021-03-31 ENCOUNTER — Encounter: Payer: Self-pay | Admitting: Neurology

## 2021-03-31 ENCOUNTER — Other Ambulatory Visit: Payer: Self-pay | Admitting: Physical Medicine and Rehabilitation

## 2021-03-31 ENCOUNTER — Encounter: Payer: Self-pay | Admitting: Physical Medicine and Rehabilitation

## 2021-03-31 DIAGNOSIS — R2689 Other abnormalities of gait and mobility: Secondary | ICD-10-CM

## 2021-03-31 DIAGNOSIS — R269 Unspecified abnormalities of gait and mobility: Secondary | ICD-10-CM

## 2021-04-23 NOTE — Progress Notes (Signed)
NEUROLOGY CONSULTATION NOTE  Maria Lynch MRN: [id number] DOB: [birth year]  Referring provider: Ellin Goodie, NP Primary care provider: Jonah Blue, MD  Reason for consult:  gait instability  Assessment/Plan:   Muscle weakness/spasms  Check blood work:  ANA with reflex, sed rate, CRP, RF, B12, TSH, D, Mg, CK, aldolase Check NCV-EMG - right upper and left lower extremities Further recommendations pending results.   Follow up after testing.   Subjective:  Maria Lynch is a 40 year old female who presents for gait instability/balance problems.  History supplemented by orthopedics and physiatry notes.  She is accompanied by her husband.   She has had progressively worsening left lateral hip pain since mid-2021.  She was diagnosed with left grater trochanteric bursitis and responded well to injection.  However, she has had other progressive symptoms not explained by the bursitis.  She notes weakness in all of her limbs.  Feet feel heavy.  She has difficulty standing from the floor or from a chair.  She has difficulty walking up and down stairs.  She reports loss of dexterity in her right hand.  Sometimes she will experience cramping in the right hand with spasms up the arm.  She was diagnosed with writer's cramp.  At night, she notes muscle spasms in her legs. She feels overall stiff, worse in the morning but improves later in the day.  Denies generalized joint pain.  She feels off balance.  She still appears to limp on her left leg even though pain has resolved.  Sometimes she feels like she is slightly slurring her words.  No numbness or tingling.  Denies double vision and dysphagia.  She used to frequently run, including half marathons.  She stopped about 3 years ago due to causing hip pain.  She still likes to swim and perform yoga.  Bilateral hip X-rays on 11/13/2020 were unremarkable.  MRI of lumbar spine on 03/03/2021 personally reviewed showed mild multilevel facet arthropathy  with central disc protrusion at L4-L5 and disc desiccation and disc height loss at L5-S1 but no significant canal or foraminal stenosis.    Denies family history of similar symptoms.   PAST MEDICAL HISTORY: Past Medical History:  Diagnosis Date   GERD (gastroesophageal reflux disease)    Prediabetes     PAST SURGICAL HISTORY: Past Surgical History:  Procedure Laterality Date   WISDOM TOOTH EXTRACTION      MEDICATIONS: Current Outpatient Medications on File Prior to Visit  Medication Sig Dispense Refill   cyclobenzaprine (FLEXERIL) 5 MG tablet Take 1 tablet (5 mg total) by mouth daily as needed for muscle spasms. 30 tablet 1   meloxicam (MOBIC) 15 MG tablet Take 1 tablet (15 mg total) by mouth daily. 30 tablet 2   No current facility-administered medications on file prior to visit.    ALLERGIES: No Known Allergies  FAMILY HISTORY: Family History  Problem Relation Age of Onset   Ovarian cancer Maternal Grandmother    Diabetes Maternal Grandfather    Heart disease Paternal Grandmother     Objective:  Blood pressure 137/87, pulse 76, resp. rate 20, height 5\' 3"  (1.6 m), SpO2 95 %. General: No acute distress.  Patient appears well-groomed.   Head:  Normocephalic/atraumatic Eyes:  fundi examined but not visualized Neck: supple, no paraspinal tenderness, full range of motion Back: No paraspinal tenderness Heart: regular rate and rhythm Lungs: Clear to auscultation bilaterally. Vascular: No carotid bruits. Neurological Exam: Mental status: alert and oriented to person, place, and time, recent and  remote memory intact, fund of knowledge intact, attention and concentration intact, speech fluent and not dysarthric, language intact. Cranial nerves: CN I: not tested CN II: pupils equal, round and reactive to light, visual fields intact CN III, IV, VI:  full range of motion, no nystagmus, no ptosis CN V: facial sensation intact. CN VII: upper and lower face symmetric CN  VIII: hearing intact CN IX, X: gag intact, uvula midline CN XI: sternocleidomastoid and trapezius muscles intact CN XII: tongue midline Bulk & Tone: normal, no fasciculations. Motor:  Uses upper body to push self up from chair.  muscle strength 5-/5 bilateral interossei, 3/5 bilateral APBs, left EHL, 5-/5 left ankle DF, otherwise 5/5 throughout Sensation:  Pinprick, temperature and vibratory sensation intact. Deep Tendon Reflexes:  2+ throughout,  toes downgoing.   Finger to nose testing:  Without dysmetria.   Heel to shin:  Without dysmetria.   Gait:  Normal station and stride.  Cautious but able to tandem walk.  Romberg negative.    Thank you for allowing me to take part in the care of this patient.  Shon Millet, DO  CC:  Jonah Blue, MD  Ellin Goodie, NP

## 2021-04-24 ENCOUNTER — Other Ambulatory Visit: Payer: Self-pay

## 2021-04-24 ENCOUNTER — Encounter: Payer: Self-pay | Admitting: Neurology

## 2021-04-24 ENCOUNTER — Other Ambulatory Visit (INDEPENDENT_AMBULATORY_CARE_PROVIDER_SITE_OTHER): Payer: 59

## 2021-04-24 ENCOUNTER — Ambulatory Visit: Payer: 59 | Admitting: Neurology

## 2021-04-24 VITALS — BP 137/87 | HR 76 | Resp 20 | Ht 63.0 in

## 2021-04-24 DIAGNOSIS — M791 Myalgia, unspecified site: Secondary | ICD-10-CM

## 2021-04-24 DIAGNOSIS — M6281 Muscle weakness (generalized): Secondary | ICD-10-CM

## 2021-04-24 NOTE — Patient Instructions (Addendum)
Check blood work:  ANA with reflex, sed rate, CRP, RF, B12, TSH, Mg, CK, aldolase Check nerve conduction study - right upper and left lower extremities Further recommendations pending results.

## 2021-04-26 LAB — VITAMIN B12: Vitamin B-12: 455 pg/mL (ref 232–1245)

## 2021-04-26 LAB — VITAMIN D 25 HYDROXY (VIT D DEFICIENCY, FRACTURES): Vit D, 25-Hydroxy: 50.6 ng/mL (ref 30.0–100.0)

## 2021-04-26 LAB — SEDIMENTATION RATE: Sed Rate: 12 mm/hr (ref 0–32)

## 2021-04-26 LAB — CK: Total CK: 202 U/L — ABNORMAL HIGH (ref 32–182)

## 2021-04-26 LAB — RHEUMATOID FACTOR: Rheumatoid fact SerPl-aCnc: <10 IU/mL (ref <14.0)

## 2021-04-26 LAB — C-REACTIVE PROTEIN: CRP: 2 mg/L (ref 0–10)

## 2021-04-26 LAB — MAGNESIUM: Magnesium: 2 mg/dL (ref 1.6–2.3)

## 2021-04-26 LAB — TSH: TSH: 1.13 u[IU]/mL (ref 0.450–4.500)

## 2021-04-26 LAB — ALDOLASE: Aldolase: 5.5 U/L (ref 3.3–10.3)

## 2021-04-26 LAB — ANA W/REFLEX: Anti Nuclear Antibody (ANA): NEGATIVE

## 2021-04-27 ENCOUNTER — Telehealth: Payer: Self-pay | Admitting: Neurology

## 2021-04-27 NOTE — Telephone Encounter (Signed)
Patient returned call for lab results to [facility]. She said it is okay to leave the details on her voice mail.

## 2021-04-27 NOTE — Progress Notes (Signed)
Tried calling pt no answer. LMOVM to call the office back °

## 2021-04-28 NOTE — Telephone Encounter (Signed)
Left the following message on her voicemail. And told her he will contact her again after those test results are in.   Drema Dallas, DO  04/27/2021  7:06 AM EST     Labs are all unremarkable.  One of the tests is borderline elevated which is not considered clinically concerning.  Next step would be the nerve study with Dr. Allena Katz.

## 2021-05-02 ENCOUNTER — Encounter: Payer: Self-pay | Admitting: Neurology

## 2021-05-09 ENCOUNTER — Other Ambulatory Visit: Payer: Self-pay | Admitting: Internal Medicine

## 2021-05-09 DIAGNOSIS — M5416 Radiculopathy, lumbar region: Secondary | ICD-10-CM

## 2021-05-09 NOTE — Telephone Encounter (Signed)
Requested medication (s) are due for refill today: yes  Requested medication (s) are on the active medication list: yes  Last refill:  03/06/21 #30 1 RF  Future visit scheduled: yes  Notes to clinic:  med not delegated to NT to RF    Requested Prescriptions  Pending Prescriptions Disp Refills   cyclobenzaprine (FLEXERIL) 5 MG tablet [Pharmacy Med Name: CYCLOBENZAPRINE 5 MG TABLET] 30 tablet 1    Sig: TAKE 1 TABLET BY MOUTH DAILY AS NEEDED FOR MUSCLE SPASMS.     Not Delegated - Analgesics:  Muscle Relaxants Failed - 05/09/2021  9:03 AM      Failed - This refill cannot be delegated      Passed - Valid encounter within last 6 months    Recent Outpatient Visits           2 months ago Chronic lumbar radiculopathy   [facility] Marcine Matar, MD   6 months ago Encounter to establish care   [facility] Marcine Matar, MD       Future Appointments             In 3 months Laural Benes Binnie Rail, MD [facility]

## 2021-05-28 ENCOUNTER — Other Ambulatory Visit: Payer: Self-pay

## 2021-05-28 ENCOUNTER — Ambulatory Visit: Payer: Managed Care, Other (non HMO) | Admitting: Neurology

## 2021-05-28 DIAGNOSIS — M6281 Muscle weakness (generalized): Secondary | ICD-10-CM

## 2021-05-28 DIAGNOSIS — M791 Myalgia, unspecified site: Secondary | ICD-10-CM | POA: Diagnosis not present

## 2021-05-28 NOTE — Procedures (Signed)
[facility]  [address] [place], [address]  [place], [place] [address] Tel: [phone] Fax:  [phone] Test Date:  05/28/2021  Patient: Maria Lynch DOB: 07/07/1980 Physician: Nita Sickle, DO  Sex: Female Height: 5\' 3"  Ref Phys: , D.O.  ID#: Shon Millet   Technician:    Patient Complaints: This is a 41 year old female referred for evaluation of generalized weakness pain.  NCV & EMG Findings: Electrodiagnostic testing was prematurely terminated at patient's request due to pain.  Findings are as follows:  Right median and ulnar sensory responses are within normal limits. Right median motor response shows reduced amplitude (1.9 mV).     Impression: This is an incomplete study, as testing was terminated at patient's request due to pain.  No meaningful conclusions can be made.   ___________________________ 46, DO    Nerve Conduction Studies Anti Sensory Summary Table   Stim Site NR Peak (ms) Norm Peak (ms) P-T Amp (V) Norm P-T Amp  Right Median Anti Sensory (2nd Digit)  32C  Wrist    2.6 <3.4 75.8 >20  Right Ulnar Anti Sensory (5th Digit)  32C  Wrist    2.3 <3.1 70.8 >12   Motor Summary Table   Stim Site NR Onset (ms) Norm Onset (ms) O-P Amp (mV) Norm O-P Amp Site1 Site2 Delta-0 (ms) Dist (cm) Vel (m/s) Norm Vel (m/s)  Right Median Motor (Abd Poll Brev)  32C  Wrist    3.6 <3.9 1.9 >6 Elbow Wrist 4.6 27.0 59 >50  Elbow    8.2  1.9             Waveforms:

## 2021-05-29 ENCOUNTER — Encounter: Payer: Self-pay | Admitting: Neurology

## 2021-05-30 ENCOUNTER — Encounter: Payer: Self-pay | Admitting: Internal Medicine

## 2021-05-30 DIAGNOSIS — F411 Generalized anxiety disorder: Secondary | ICD-10-CM

## 2021-06-01 ENCOUNTER — Other Ambulatory Visit: Payer: Self-pay

## 2021-06-01 NOTE — Progress Notes (Signed)
[facility] order filled out. Waiting on Patient to come by to sign paperwork.    Myotonic Dystrophy Panel.

## 2021-06-04 ENCOUNTER — Ambulatory Visit: Payer: 59 | Admitting: Neurology

## 2021-06-19 ENCOUNTER — Telehealth: Payer: Self-pay | Admitting: Neurology

## 2021-06-19 NOTE — Telephone Encounter (Signed)
Charlene from [employer] called regarding this patient.  She stated that there was a missing diagnosis  in a report that was sent over.   [phone]

## 2021-06-19 NOTE — Telephone Encounter (Signed)
Tried calling pt, No answer. LMOVM to call back.

## 2021-06-26 NOTE — Telephone Encounter (Signed)
ICD10 codes given M62.81,M79.10

## 2021-06-29 ENCOUNTER — Ambulatory Visit: Payer: 59 | Admitting: Neurology

## 2021-07-29 ENCOUNTER — Telehealth: Payer: Self-pay | Admitting: Neurology

## 2021-07-29 NOTE — Telephone Encounter (Signed)
The genetic panel for myotonic dystrophy was negative.  The best test would be the nerve conduction study that she was unable to tolerate.  I would recommend referring her to a neuromuscular specialist at either [facility] or [facility] where they may be able to perform other tests not available to me that can help establish a diagnosis. ?

## 2021-07-31 NOTE — Telephone Encounter (Signed)
Tried calling pt, No answer. Phone line busy ?

## 2021-08-03 NOTE — Telephone Encounter (Signed)
Second attempt at calling patient no answer phone line busy.  ? ?Will send a Mychart message to patient o have her call the office.  ?

## 2021-08-04 ENCOUNTER — Telehealth: Payer: Self-pay | Admitting: Neurology

## 2021-08-04 DIAGNOSIS — M791 Myalgia, unspecified site: Secondary | ICD-10-CM

## 2021-08-04 DIAGNOSIS — M6281 Muscle weakness (generalized): Secondary | ICD-10-CM

## 2021-08-04 NOTE — Telephone Encounter (Signed)
Patient called back

## 2021-08-04 NOTE — Telephone Encounter (Signed)
Patient LM with AN. She would like to get her results ?

## 2021-08-05 NOTE — Telephone Encounter (Signed)
Called patient and unable to leave a message as phone just beeps a busy tone.  ?

## 2021-08-06 NOTE — Telephone Encounter (Signed)
Called patient and unable to leave a message due to phone just beeping. Will send patient a mychart message. ?

## 2021-08-06 NOTE — Telephone Encounter (Signed)
Patient returned call and I have provided results from Dr. Tomi Likens from previous encounter. Patient has been advised that The genetic panel for myotonic dystrophy was negative.  The best test would be the nerve conduction study that she was unable to tolerate.  I would recommend referring her to a neuromuscular specialist at either [facility] or [place] where they may be able to perform other tests not available to me that can help establish a diagnosis. ? ?Patient is agreeable to have a referral sent. I will send a referral to [facility]. Patient is aware.  ? ?Referral created and faxed to [facility]. ?

## 2021-09-01 ENCOUNTER — Telehealth: Payer: Self-pay | Admitting: Neurology

## 2021-09-01 DIAGNOSIS — M6281 Muscle weakness (generalized): Secondary | ICD-10-CM

## 2021-09-01 NOTE — Telephone Encounter (Signed)
Patient called and stated she would like a referral sent to [facility].  Their fax number is [phone].   ?

## 2021-09-01 NOTE — Telephone Encounter (Signed)
LMOVM for pt, New referral sent [facility] as note 08/06/2021.  ? ?Please let us know if you do not want that referral anymore.  ?

## 2021-09-01 NOTE — Telephone Encounter (Signed)
PER Pt she will like to be referred out to [facility]. ?Referral sent Via Epic unable to print right now.  ?

## 2021-09-04 ENCOUNTER — Ambulatory Visit: Payer: 59 | Admitting: Internal Medicine

## 2021-09-11 ENCOUNTER — Telehealth: Payer: Self-pay | Admitting: Internal Medicine

## 2021-09-11 ENCOUNTER — Encounter: Payer: Self-pay | Admitting: Internal Medicine

## 2021-09-11 ENCOUNTER — Ambulatory Visit: Payer: Managed Care, Other (non HMO) | Attending: Internal Medicine | Admitting: Internal Medicine

## 2021-09-11 VITALS — BP 120/82 | HR 79 | Ht 63.0 in

## 2021-09-11 DIAGNOSIS — Z5320 Procedure and treatment not carried out because of patient's decision for unspecified reasons: Secondary | ICD-10-CM

## 2021-09-11 DIAGNOSIS — R29898 Other symptoms and signs involving the musculoskeletal system: Secondary | ICD-10-CM

## 2021-09-11 DIAGNOSIS — R269 Unspecified abnormalities of gait and mobility: Secondary | ICD-10-CM

## 2021-09-11 DIAGNOSIS — Z23 Encounter for immunization: Secondary | ICD-10-CM

## 2021-09-11 NOTE — Patient Instructions (Signed)
Tdap (Tetanus, Diphtheria, Pertussis) Vaccine: What You Need to Know 1. Why get vaccinated? Tdap vaccine can prevent tetanus, diphtheria, and pertussis. Diphtheria and pertussis spread from person to person. Tetanus enters the body through cuts or wounds. TETANUS (T) causes painful stiffening of the muscles. Tetanus can lead to serious health problems, including being unable to open the mouth, having trouble swallowing and breathing, or death. DIPHTHERIA (D) can lead to difficulty breathing, heart failure, paralysis, or death. PERTUSSIS (aP), also known as "whooping cough," can cause uncontrollable, violent coughing that makes it hard to breathe, eat, or drink. Pertussis can be extremely serious especially in babies and young children, causing pneumonia, convulsions, brain damage, or death. In teens and adults, it can cause weight loss, loss of bladder control, passing out, and rib fractures from severe coughing. 2. Tdap vaccine Tdap is only for children 7 years and older, adolescents, and adults.  Adolescents should receive a single dose of Tdap, preferably at age 11 or 12 years. Pregnant people should get a dose of Tdap during every pregnancy, preferably during the early part of the third trimester, to help protect the newborn from pertussis. Infants are most at risk for severe, life-threatening complications from pertussis. Adults who have never received Tdap should get a dose of Tdap. Also, adults should receive a booster dose of either Tdap or Td (a different vaccine that protects against tetanus and diphtheria but not pertussis) every 10 years, or after 5 years in the case of a severe or dirty wound or burn. Tdap may be given at the same time as other vaccines. 3. Talk with your health care provider Tell your vaccine provider if the person getting the vaccine: Has had an allergic reaction after a previous dose of any vaccine that protects against tetanus, diphtheria, or pertussis, or has any  severe, life-threatening allergies Has had a coma, decreased level of consciousness, or prolonged seizures within 7 days after a previous dose of any pertussis vaccine (DTP, DTaP, or Tdap) Has seizures or another nervous system problem Has ever had Guillain-Barr Syndrome (also called "GBS") Has had severe pain or swelling after a previous dose of any vaccine that protects against tetanus or diphtheria In some cases, your health care provider may decide to postpone Tdap vaccination until a future visit. People with minor illnesses, such as a cold, may be vaccinated. People who are moderately or severely ill should usually wait until they recover before getting Tdap vaccine.  Your health care provider can give you more information. 4. Risks of a vaccine reaction Pain, redness, or swelling where the shot was given, mild fever, headache, feeling tired, and nausea, vomiting, diarrhea, or stomachache sometimes happen after Tdap vaccination. People sometimes faint after medical procedures, including vaccination. Tell your provider if you feel dizzy or have vision changes or ringing in the ears.  As with any medicine, there is a very remote chance of a vaccine causing a severe allergic reaction, other serious injury, or death. 5. What if there is a serious problem? An allergic reaction could occur after the vaccinated person leaves the clinic. If you see signs of a severe allergic reaction (hives, swelling of the face and throat, difficulty breathing, a fast heartbeat, dizziness, or weakness), call 9-1-1 and get the person to the nearest hospital. For other signs that concern you, call your health care provider.  Adverse reactions should be reported to the Vaccine Adverse Event Reporting System (VAERS). Your health care provider will usually file this report, or you   can do it yourself. Visit the VAERS website at www.vaers.hhs.gov or call 1-800-822-7967. VAERS is only for reporting reactions, and VAERS staff  members do not give medical advice. 6. The National Vaccine Injury Compensation Program The National Vaccine Injury Compensation Program (VICP) is a federal program that was created to compensate people who may have been injured by certain vaccines. Claims regarding alleged injury or death due to vaccination have a time limit for filing, which may be as short as two years. Visit the VICP website at www.hrsa.gov/vaccinecompensation or call 1-800-338-2382 to learn about the program and about filing a claim. 7. How can I learn more? Ask your health care provider. Call your local or state health department. Visit the website of the Food and Drug Administration (FDA) for vaccine package inserts and additional information at www.fda.gov/vaccines-blood-biologics/vaccines. Contact the Centers for Disease Control and Prevention (CDC): Call 1-800-232-4636 (1-800-CDC-INFO) or Visit CDC's website at www.cdc.gov/vaccines. Source: CDC Vaccine Information Statement Tdap (Tetanus, Diphtheria, Pertussis) Vaccine (12/21/2019) This same material is available at www.cdc.gov for no charge. This information is not intended to replace advice given to you by your health care provider. Make sure you discuss any questions you have with your health care provider. Document Revised: 04/01/2021 Document Reviewed: 02/02/2021 Elsevier Patient Education  2023 Elsevier Inc.  

## 2021-09-11 NOTE — Progress Notes (Signed)
? ? ?Patient ID: Maria Lynch, female    DOB: [birth year]  MRN: [id number] ? ?CC: Weakness ? ? ?Subjective: ?Maria Lynch is a 41 y.o. female who presents for chronic ds management ?Her concerns today include:  ?GERD, PreDM, fibrocystic breast ds, HL ?  ?Since last visit with me, patient had follow-up with Dr. Roda Shutters for left hip pain and left lower extremity weakness.  She had MRI of the lumbar spine done in October of last year that revealed degenerative changes and bulging disc.  Patient was tried with physical therapy and NSAIDs without relief.  He referred her to Dr. Alvester Morin.  saw Dr. Alvester Morin 03/2021 who dx her with left trochanteric bursitis, gait disturbance and balance issues.  Patient states steroid injection to the bursa was recommended but she wanted to hold off on doing that. It has been scheduled for next mth.  He referred her to neurology Dr. Everlena Cooper. ? ?  She saw Dr. Everlena Cooper 04/2021.  He was concerned with muscle weakness in her extremities and spasms.  He did some basic lab work-up including CK, aldolase, sed rate, ANA, B12 and TSH levels which were okay.  According to his telephone note from 07/29/2021, genetic panel for myotonic dystrophy was negative.  He had referred patient for EMG but study was incomplete as patient was not able to tolerate it.  Patient tells me that she had a panic attack.  She felt she would have done better if her significant other was allowed in the room but that was not allowed.  Dr. Everlena Cooper recommended that she be seen at [facility] or [facility] by a neuromuscular specialist for further evaluation.  Patient would like to see specialist at [facility] by the name of Dr. Garen Lah but she is open to being seen at [facility] also.  She will take whichever 1 will see her first. ?-She is concerned and anxious about her symptoms.  She worries that she may have something like MS or Parkinson's.  She feels symptoms have progressed since she last saw me in October. ? ?Today she reports that she  continues to have weakness in the right arm especially the grip.  She can barely write anymore.  Weakness in her legs but more so on the left side.  She has difficulty climbing and descending stairs.  She has trouble walking first thing in the morning but it gets better as the day progresses.  She used to walk for exercise but can no longer do so.  Started swimming 2 to 3 days a week and does have limited yoga.  She has difficulty getting up from chairs.  A few weeks ago she tripped over the door frame and fell.  She does not recall any tick bites last year in the summer or fall.  No blurred vision or double vision. ? ?Started seeing an Scientist, clinical (histocompatibility and immunogenetics) for anxiety.  Currently on BuSpar 10 mg twice a day and Zoloft 50 mg daily which she finds very helpful. ? ?She would like to get the Tdap vaccine. ?Patient Active Problem List  ? Diagnosis Date Noted  ? Chronic lumbar radiculopathy 03/06/2021  ? Hip pain, chronic, left 11/04/2020  ? Writer's cramp 11/04/2020  ? Overweight (BMI 25.0-29.9) 11/04/2020  ? Prediabetes 11/04/2020  ? Gastroesophageal reflux disease without esophagitis 11/04/2020  ?  ? ?Current Outpatient Medications on File Prior to Visit  ?Medication Sig Dispense Refill  ? busPIRone (BUSPAR) 10 MG tablet Take 10 mg by mouth 2 (two) times daily.    ?  cyclobenzaprine (FLEXERIL) 5 MG tablet TAKE 1 TABLET BY MOUTH DAILY AS NEEDED FOR MUSCLE SPASMS. 30 tablet 1  ? sertraline (ZOLOFT) 50 MG tablet Take 50 mg by mouth daily.    ? meloxicam (MOBIC) 15 MG tablet Take 1 tablet (15 mg total) by mouth daily. 30 tablet 2  ? ?No current facility-administered medications on file prior to visit.  ? ? ?No Known Allergies ? ?Social History  ? ?Socioeconomic History  ? Marital status: Media plannerDomestic Partner  ?  Spouse name: Not on file  ? Number of children: 0  ? Years of education: Not on file  ? Highest education level: Doctorate  ?Occupational History  ? Occupation: minister  ?Tobacco Use  ? Smoking status: Never  ?  Smokeless tobacco: Never  ?Vaping Use  ? Vaping Use: Never used  ?Substance and Sexual Activity  ? Alcohol use: Yes  ?  Alcohol/week: 8.0 - 10.0 standard drinks  ?  Types: 8 - 10 Cans of beer per week  ? Drug use: Yes  ?  Comment: edible marijuana  ? Sexual activity: Not on file  ?Other Topics Concern  ? Not on file  ?Social History Narrative  ? Right handed  ? One story home  ? Drinks caffeine  ? ?Social Determinants of Health  ? ?Financial Resource Strain: Not on file  ?Food Insecurity: Not on file  ?Transportation Needs: Not on file  ?Physical Activity: Not on file  ?Stress: Not on file  ?Social Connections: Not on file  ?Intimate Partner Violence: Not on file  ? ? ?Family History  ?Problem Relation Age of Onset  ? Ovarian cancer Maternal Grandmother   ? Diabetes Maternal Grandfather   ? Heart disease Paternal Grandmother   ? ? ?Past Surgical History:  ?Procedure Laterality Date  ? WISDOM TOOTH EXTRACTION    ? ? ?ROS: ?Review of Systems ?Negative except as stated above ? ?PHYSICAL EXAM: ?BP 120/82   Pulse 79   Ht 5\' 3"  (1.6 m)   SpO2 94%   BMI 29.58 kg/m?   ?Pt declined getting on scale today ?Physical Exam ? ?General appearance - alert, well appearing, young to middle-aged Caucasian female and in no distress ?Mental status - normal mood, behavior, speech, dress, motor activity, and thought processes ?Neurological - cranial nerves II through XII intact except she is noted to have some fasciculations of the tongue. ?Grip 4/5 right, 5/5 left.  Mild tremor noted of the right index finger intermittently. ?Power 5/5 bilaterally proximal and distal upper extremities. ?Power 5/5 distally and 4/5 proximally of the lower extremities. ?Power 3-4/5 of the left foot dorsi and plantarflexion. ?Wide-based and waddling gait with low foot to floor clearance especially the left foot. ?Romberg is negative. ?Gross sensation intact. ?Hyper-reflexes in both the upper and lower extremities ? ?Skin: No rash noted around the neck or  over the knuckles. ? ?  Latest Ref Rng & Units 11/04/2020  ?  9:57 AM  ?CMP  ?Glucose 65 - 99 mg/dL 89    ?BUN 6 - 24 mg/dL 12    ?Creatinine 0.57 - 1.00 mg/dL 8.650.73    ?Sodium 134 - 144 mmol/L 138    ?Potassium 3.5 - 5.2 mmol/L 4.7    ?Chloride 96 - 106 mmol/L 101    ?CO2 20 - 29 mmol/L 20    ?Calcium 8.7 - 10.2 mg/dL 9.9    ?Total Protein 6.0 - 8.5 g/dL 7.2    ?Total Bilirubin 0.0 - 1.2 mg/dL 0.3    ?  Alkaline Phos 44 - 121 IU/L 63    ?AST 0 - 40 IU/L 16    ?ALT 0 - 32 IU/L 20    ? ?Lipid Panel  ?   ?Component Value Date/Time  ? CHOL 186 11/04/2020 0957  ? TRIG 121 11/04/2020 0957  ? HDL 54 11/04/2020 0957  ? CHOLHDL 3.4 11/04/2020 0957  ? LDLCALC 110 (H) 11/04/2020 0957  ? ? ?CBC ?   ?Component Value Date/Time  ? WBC 9.0 11/04/2020 0957  ? RBC 4.53 11/04/2020 0957  ? HGB 13.9 11/04/2020 0957  ? HCT 41.4 11/04/2020 0957  ? PLT 324 11/04/2020 0957  ? MCV 91 11/04/2020 0957  ? MCH 30.7 11/04/2020 0957  ? MCHC 33.6 11/04/2020 0957  ? RDW 12.5 11/04/2020 0957  ? ? ?ASSESSMENT AND PLAN: ?1. Gait disturbance ?2. Weakness of both lower extremities ?-Symptoms are concerning.  Differential diagnoses can include MS, ALS.  Doubt myasthenia gravis based on history.  We will get MRI of the brain.  I have submitted the referral for her to be seen by neurology either at [facility] or [facility] but she prefers [facility].  Message sent to our referral coordinator to make her aware.  Given recent fall and physical exam findings, I recommend using a cane.  Prescription given for these.  Declines referral to physical therapy again as she did not find that helpful. ?- Ambulatory referral to Neurology ?- MR Brain W Wo Contrast; Future ?- For home use only DME Other see comment ? ?3. Weakness of hand ?- Ambulatory referral to Neurology ?- MR Brain W Wo Contrast; Future ? ?4. HIV screening declined ?Patient declines screening for hepatitis C and HIV as part of her health maintenance.  Does not feel she is at high risk ? ? ? ? ?Patient was  given the opportunity to ask questions.  Patient verbalized understanding of the plan and was able to repeat key elements of the plan.  ? ?This documentation was completed using Paediatric nurse.  An

## 2021-09-11 NOTE — Telephone Encounter (Signed)
-----   Message from Dionne Bucy sent at 09/11/2021  6:25 PM EDT ----- ?Regarding: RE: Neurology referral ?Noted  ?Per Patient request   ?Sent Referral to   ?[facility]   ? [address] ?[place], [place] [address] ?Office: [phone] ? Fax: [phone] ? ? ?----- Message ----- ?From: Marcine Matar, MD ?Sent: 09/11/2021   1:16 PM EDT ?To: Dionne Bucy ?Subject: Neurology referral                            ? ?I have submitted referral for neurology.  She wished to be seen at [facility] or [facility] but would prefer [facility] as her first option.  Needs to be seen at neuromuscular clinic.  Physician at [facility] whom she would like to see is Dr. Garen Lah.  She will take whichever institution can see her the earliest. Please try to get her in ASAP. ? ? ?

## 2021-09-14 ENCOUNTER — Telehealth: Payer: Self-pay | Admitting: Internal Medicine

## 2021-09-14 ENCOUNTER — Telehealth: Payer: Self-pay

## 2021-09-14 NOTE — Telephone Encounter (Signed)
Pt dropped off a form for Disability Parking placard to be signed by Dr.Johnson. Informed pt it will be 7-10 days before it is complete.Pt asked could she receive phone call when paperwork is complete. ?

## 2021-09-14 NOTE — Telephone Encounter (Signed)
Can MRI order be changed to [place] so that the prior auth can be done for her scan. ?

## 2021-09-15 ENCOUNTER — Encounter: Payer: Self-pay | Admitting: Internal Medicine

## 2021-09-15 NOTE — Addendum Note (Signed)
Addended by: Jonah Blue B on: 09/15/2021 09:05 AM ? ? Modules accepted: Orders ? ?

## 2021-09-15 NOTE — Telephone Encounter (Signed)
Noted  

## 2021-09-16 ENCOUNTER — Encounter: Payer: Self-pay | Admitting: Neurology

## 2021-09-17 ENCOUNTER — Other Ambulatory Visit: Payer: Managed Care, Other (non HMO)

## 2021-09-17 ENCOUNTER — Other Ambulatory Visit: Payer: Self-pay

## 2021-09-17 DIAGNOSIS — M6281 Muscle weakness (generalized): Secondary | ICD-10-CM

## 2021-09-17 NOTE — Progress Notes (Signed)
Per patient mychart message, ?prescribe a test for lyme disease for me? My PCP raised this as a possibility and said that you would be the best person to order the test. ? ? ?Per Dr.Jaffe, OK to order blood work for Lyme regarding weakness. ? ? ? ? ?

## 2021-09-22 ENCOUNTER — Telehealth: Payer: Self-pay | Admitting: Neurology

## 2021-09-22 ENCOUNTER — Telehealth: Payer: Self-pay | Admitting: Internal Medicine

## 2021-09-22 LAB — LYME DISEASE, WESTERN BLOT
IgG P18 Ab.: ABSENT
IgG P23 Ab.: ABSENT
IgG P28 Ab.: ABSENT
IgG P30 Ab.: ABSENT
IgG P39 Ab.: ABSENT
IgG P41 Ab.: ABSENT
IgG P45 Ab.: ABSENT
IgG P58 Ab.: ABSENT
IgG P66 Ab.: ABSENT
IgG P93 Ab.: ABSENT
IgM P23 Ab.: ABSENT
IgM P39 Ab.: ABSENT
IgM P41 Ab.: ABSENT
Lyme IgG Wb: NEGATIVE
Lyme IgM Wb: NEGATIVE

## 2021-09-22 NOTE — Telephone Encounter (Signed)
Copied from CRM [id number]. Topic: Referral - Status ?>> Sep 22, 2021 12:27 PM Traci Sermon wrote: ?Reason for CRM: Pt called in stating the referral that was sent over, [facility] did not receive the fax and pt called in with a new fax number for [phone]. Please advise.  ? ?[facility]   ? [address] ?[place], [place] [address] ?Office: [phone] ? Fax: [phone] ?

## 2021-09-22 NOTE — Telephone Encounter (Signed)
Pt called in after missing a call about results. She said a message can be left. She did see some results on mychart.  ?

## 2021-09-22 NOTE — Telephone Encounter (Signed)
Pt called with results Lyme testing is negative  ?

## 2021-09-24 ENCOUNTER — Encounter: Payer: Self-pay | Admitting: Internal Medicine

## 2021-09-26 ENCOUNTER — Inpatient Hospital Stay: Admission: RE | Admit: 2021-09-26 | Payer: Commercial Managed Care - HMO | Source: Ambulatory Visit

## 2021-09-26 ENCOUNTER — Other Ambulatory Visit: Payer: Self-pay | Admitting: Internal Medicine

## 2021-09-26 DIAGNOSIS — R269 Unspecified abnormalities of gait and mobility: Secondary | ICD-10-CM

## 2021-09-26 DIAGNOSIS — R29898 Other symptoms and signs involving the musculoskeletal system: Secondary | ICD-10-CM

## 2021-09-29 ENCOUNTER — Ambulatory Visit: Payer: Commercial Managed Care - HMO | Admitting: Physical Medicine and Rehabilitation

## 2021-09-29 ENCOUNTER — Ambulatory Visit: Payer: Self-pay

## 2021-09-29 DIAGNOSIS — M7062 Trochanteric bursitis, left hip: Secondary | ICD-10-CM

## 2021-09-29 NOTE — Progress Notes (Signed)
   Maria Lynch - 41 y.o. female MRN [id number]  Date of birth: 12-31-1980  Office Visit Note: Visit Date: 09/29/2021 PCP: Marcine Matar, MD Referred by: Marcine Matar, MD  Subjective: Chief Complaint  Patient presents with   Left Hip - Pain    left greater trochanteric bursa injection   HPI:  Maria Lynch is a 41 y.o. female who comes in today for planned repeat Left greater trochanter injection with fluoroscopic guidance.  The patient has failed conservative care including home exercise, medications, time and activity modification. Prior injection gave more than 50% relief for several months. This injection will be diagnostic and hopefully therapeutic.  Please see requesting physician notes for further details and justification.  Referring: Dr. Glee Arvin and Ellin Goodie, FNP   ROS Otherwise per HPI.  Assessment & Plan: Visit Diagnoses:    ICD-10-CM   1. Greater trochanteric bursitis, left  M70.62 XR C-ARM NO REPORT      Plan: No additional findings.   Meds & Orders: No orders of the defined types were placed in this encounter.   Orders Placed This Encounter  Procedures   XR C-ARM NO REPORT    Follow-up: Return if symptoms worsen or fail to improve.   Procedures: No procedures performed      Clinical History: No specialty comments available.     Objective:  VS:  HT:    WT:   BMI:     BP:   HR: bpm  TEMP: ( )  RESP:  Physical Exam   Imaging: No results found.

## 2021-10-04 ENCOUNTER — Ambulatory Visit
Admission: RE | Admit: 2021-10-04 | Discharge: 2021-10-04 | Disposition: A | Discharge status: Home / Self Care | Payer: Commercial Managed Care - HMO | Source: Ambulatory Visit | Attending: Internal Medicine | Admitting: Internal Medicine

## 2021-10-04 DIAGNOSIS — R29898 Other symptoms and signs involving the musculoskeletal system: Secondary | ICD-10-CM

## 2021-10-04 DIAGNOSIS — R269 Unspecified abnormalities of gait and mobility: Secondary | ICD-10-CM

## 2021-10-04 IMAGING — MR MR HEAD WO/W CM
14 series · 48 of 48 positions shown · IV contrast (multihance)
Comparison: None Available.

CLINICAL DATA: Gait disturbance [MP] ([MP]-CM). Motor neuron
disease suspected. Weakness of both lower extremities [MP]
([MP]-CM). Weakness of hand [MP] ([MP]-CM).

EXAM:
MRI HEAD WITHOUT AND WITH CONTRAST
TECHNIQUE: Multiplanar, multiecho pulse sequences of the brain and surrounding
structures were obtained without and with intravenous contrast.
CONTRAST:  15mL MULTIHANCE GADOBENATE DIMEGLUMINE 529 MG/ML IV SOLN

[Series 2: T1 · sagittal · 5.0mm · 0.45mm/px · 2 of 23 slices shown]
[im 1/23]
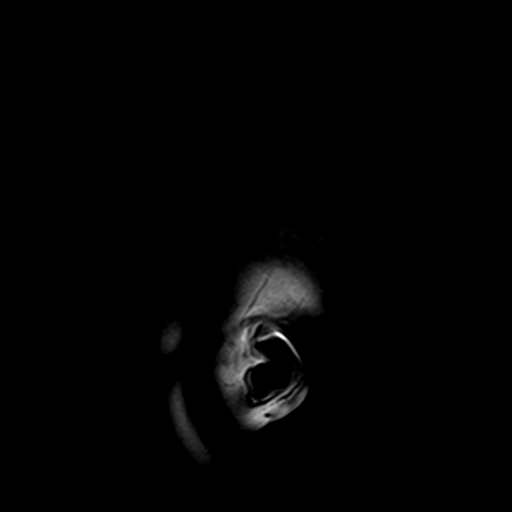
[im 23/23]
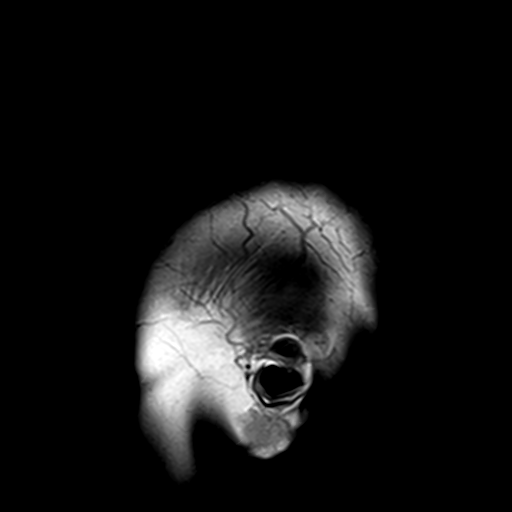

[Series 3: DWI · axial · 3.0mm · 1.80mm/px · z∈[-65,+87]mm · 6 of 104 slices shown (1 of 4)]
[im 1/104]
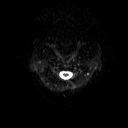
[im 21/104]
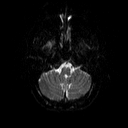
[im 42/104]
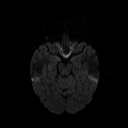
[im 62/104]
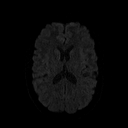
[im 83/104]
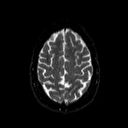
[im 104/104]
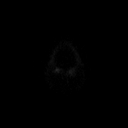

[Series 4: DWI · axial · 3.0mm · 1.80mm/px · z∈[-65,+87]mm · 3 of 52 slices shown (2 of 4)]
[im 1/52]
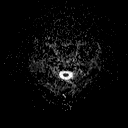
[im 26/52]
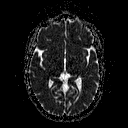
[im 52/52]
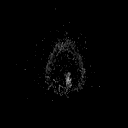

[Series 5: DWI · coronal · 5.0mm · 1.80mm/px · 5 of 75 slices shown (3 of 4)]
[im 1/75]
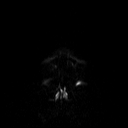
[im 19/75]
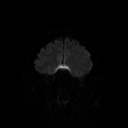
[im 38/75]
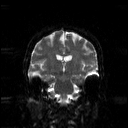
[im 56/75]
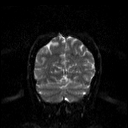
[im 75/75]
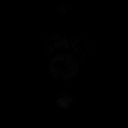

[Series 6: DWI · coronal · 5.0mm · 1.80mm/px · 2 of 38 slices shown (4 of 4)]
[im 1/38]
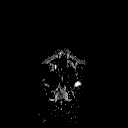
[im 38/38]
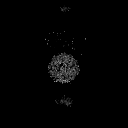

[Series 7: FLAIR · axial · 3.0mm · 0.45mm/px · z∈[-56,+78]mm · 2 of 30 slices shown (1 of 2)]
[im 1/30]
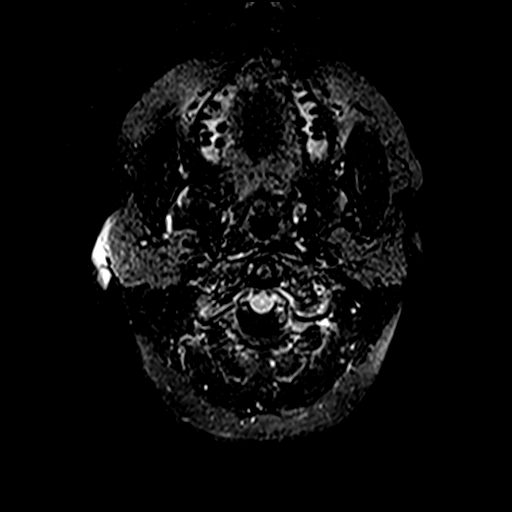
[im 30/30]
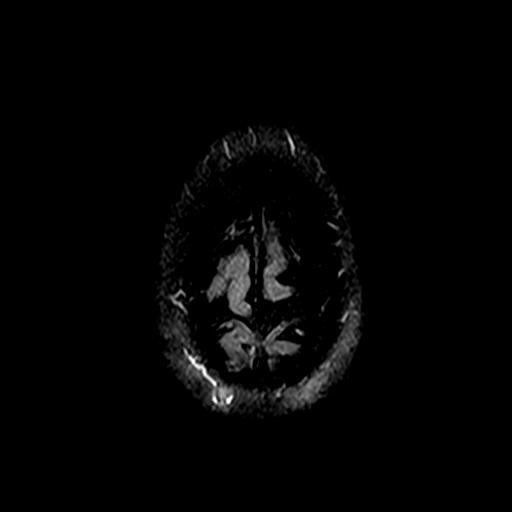

[Series 8: FLAIR · sagittal · 5.0mm · 0.45mm/px · 2 of 26 slices shown (2 of 2)]
[im 1/26]
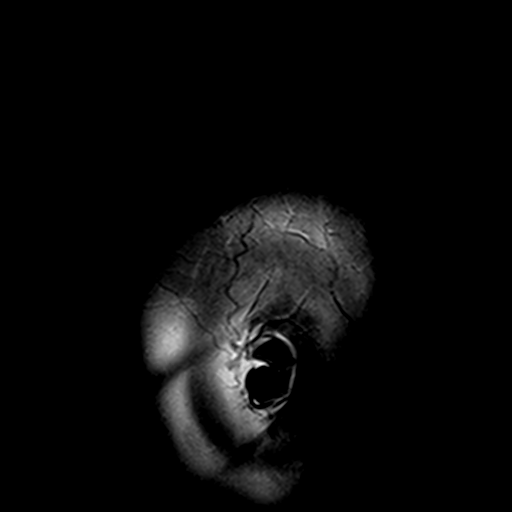
[im 26/26]
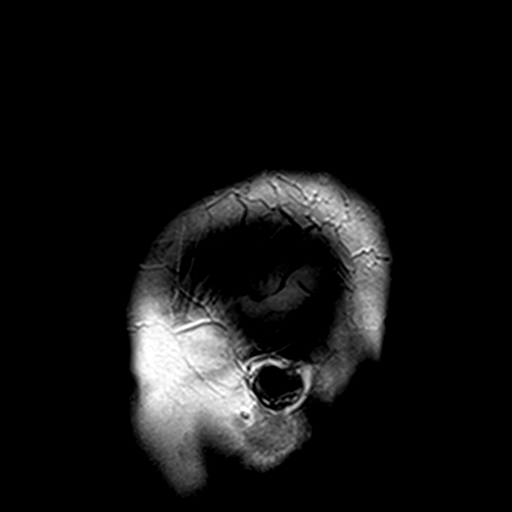

[Series 9: T2 · axial · 5.0mm · 0.60mm/px · 1 of 22 slices shown (1 of 2)]
[im 1/22]
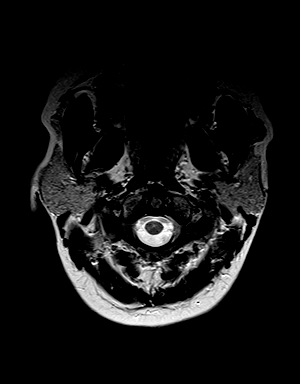

[Series 11: swi_images · axial · 4.0mm · 0.90mm/px · z∈[-59,+80]mm · 2 of 36 slices shown]
[im 1/36]
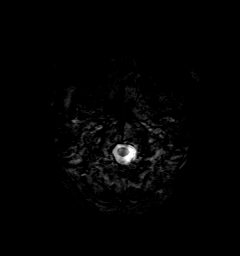
[im 36/36]
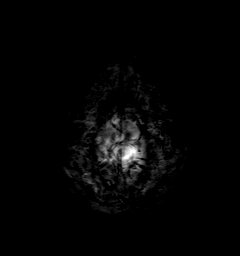

[Series 12: t1_mpr_tra · axial · 1.0mm · 0.75mm/px · z∈[-61,+80]mm · 9 of 144 slices shown]
[im 1/144]
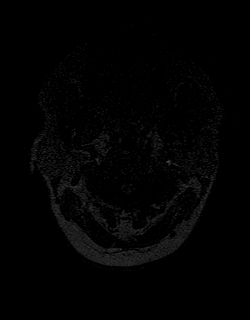
[im 18/144]
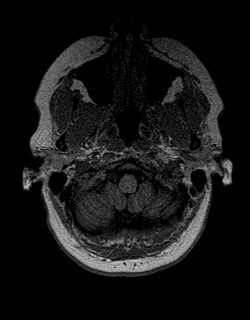
[im 36/144]
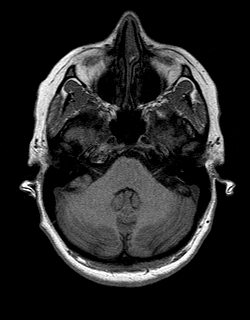
[im 54/144]
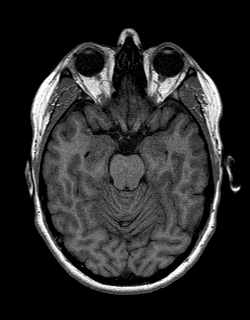
[im 72/144]
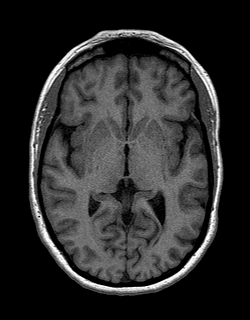
[im 90/144]
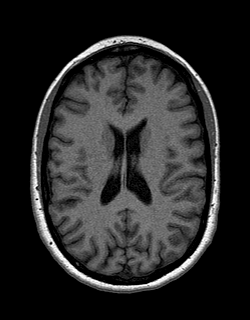
[im 108/144]
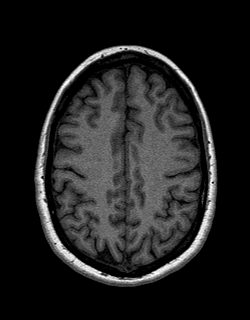
[im 126/144]
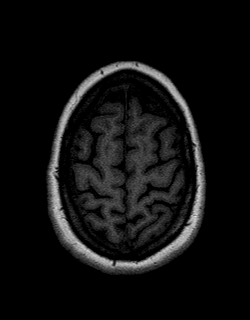
[im 144/144]
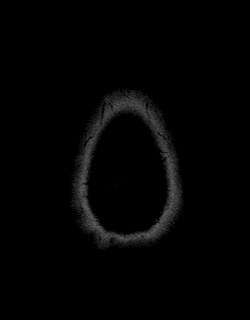

[Series 13: T2 · coronal · 5.0mm · 0.45mm/px · 2 of 28 slices shown (2 of 2)]
[im 1/28]
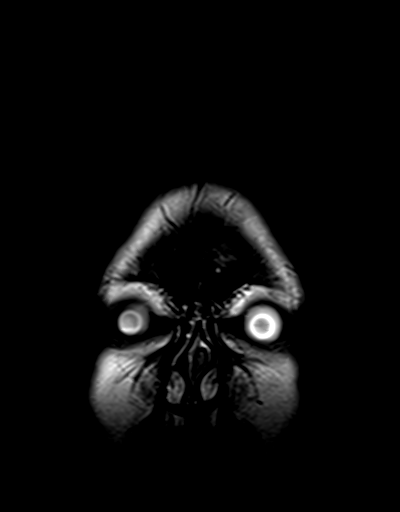
[im 28/28]
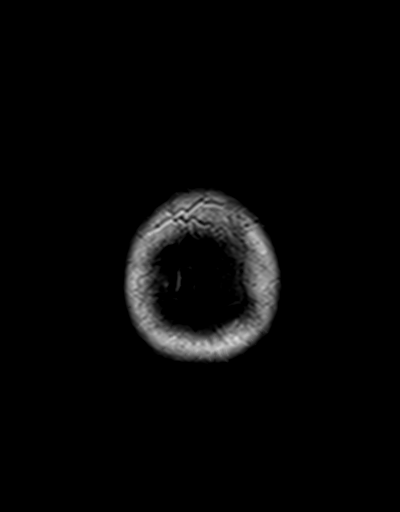

[Series 14: t1_mpr_tra post · axial · 1.0mm · 0.75mm/px · z∈[-61,+80]mm · 9 of 144 slices shown]
[im 1/144]
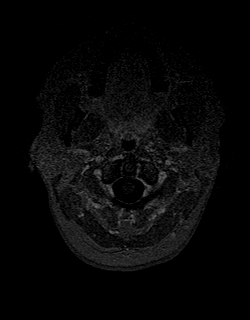
[im 18/144]
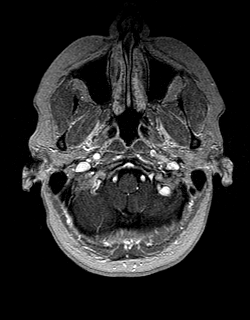
[im 36/144]
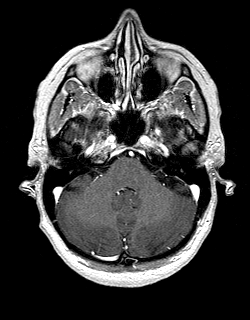
[im 54/144]
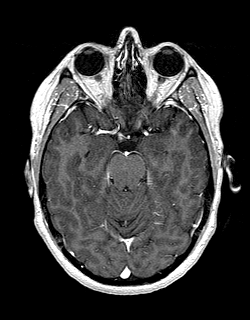
[im 72/144]
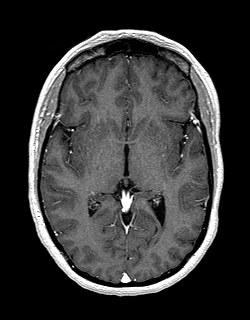
[im 90/144]
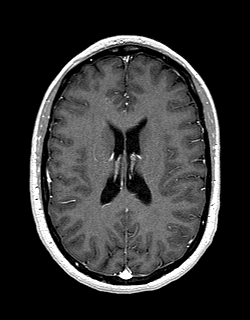
[im 108/144]
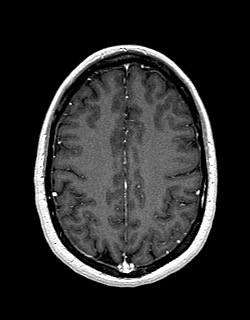
[im 126/144]
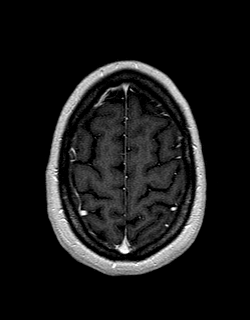
[im 144/144]
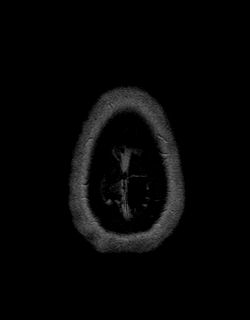

[Series 15: post cor · coronal · 5.0mm · 0.45mm/px · 2 of 28 slices shown]
[im 1/28]
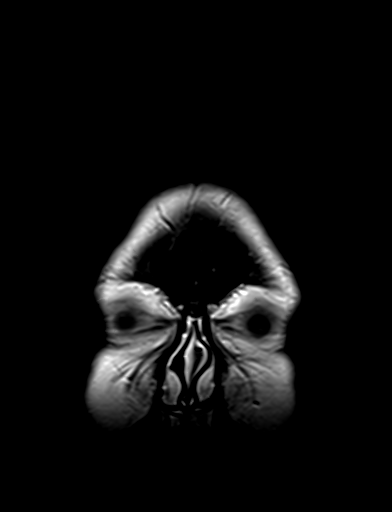
[im 28/28]
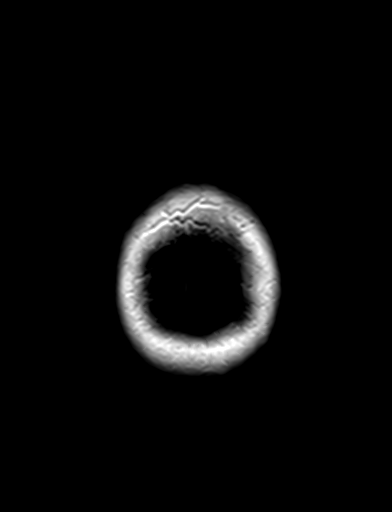

[Series 16: T1 post-contrast · sagittal · 5.0mm · 0.45mm/px · 1 of 23 slices shown]
[im 1/23]
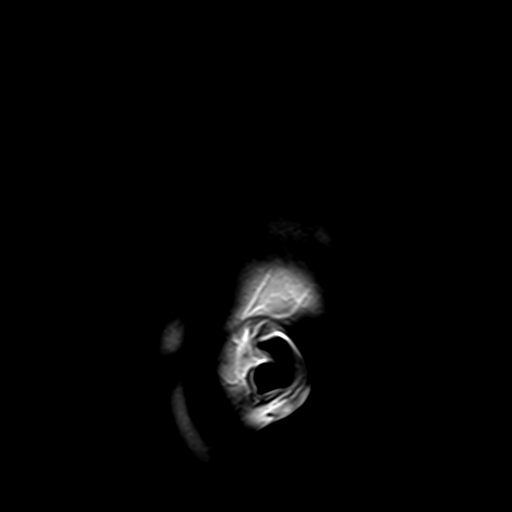

[48 of 48 positions shown; findings below may reference images not displayed]

FINDINGS: Brain: No acute infarction, hemorrhage, hydrocephalus, extra-axial
collection or mass lesion. Small amount of scattered foci of T2
hyperintensity are seen within the white matter of the cerebral
hemispheres, including deep, juxta cortical and periventricular
white matter, nonspecific. No focus of abnormal contrast
enhancement.

Vascular: Normal flow voids.

Skull and upper cervical spine: Normal marrow signal.

Sinuses/Orbits: Negative.

Other: None.
IMPRESSION: Small amount of nonspecific T2 hyperintense lesions of the white
matter. Differential diagnosis include demyelinating disease,
vasculitis, post inflammatory/infectious or early chronic
microangiopathy in the setting of uncontrolled risk factors.

## 2021-10-04 MED ORDER — GADOBENATE DIMEGLUMINE 529 MG/ML IV SOLN
15.0000 mL | Freq: Once | INTRAVENOUS | Status: AC | PRN
  Administered 2021-10-04: 15 mL via INTRAVENOUS

## 2021-10-06 ENCOUNTER — Telehealth: Payer: Self-pay | Admitting: Internal Medicine

## 2021-10-06 NOTE — Telephone Encounter (Signed)
Phone call placed to patient this a.m. to go over the results of the MRI of the brain.  I informed her that it showed a small area of nonspecific changes.  Differential diagnosis that the radiologist gave were demyelinating process, vasculitis, postinflammatory or infectious process etc.  Inform patient that this does not give [place] a direct answer but was certainly worth doing as part of her work-up.  I will inbox Dr. Everlena Cooper about the results of her MRI results. In regards to her getting in with the neurologist at [facility], patient has appointment scheduled for October.  She did request to be placed on the cancellation list.  She had also sent me a MyChart message requesting to be referred to Dr. Epimenio Foot here locally.  Informed her that I heard from his office the end of last week.  They stated that they do not have always sure what on EMG techs so they would not be able to offer that at this time.  They wanted to know why she wanted to see him given that she had already seen Dr. Everlena Cooper and was referred to [facility] neurology.  I told them that she wanted a second opinion locally because she was having a difficult time getting an appointment with [facility] neurology.  Advised patient that they may or may not accept the referral.  Advised that she keeps her upcoming appointment with Dr. Everlena Cooper which is scheduled for July.  She expressed understanding.

## 2021-10-21 NOTE — Progress Notes (Incomplete)
NEUROLOGY FOLLOW UP OFFICE NOTE  Maria Lynch [id number]  Assessment/Plan:   Muscle weakness/spasms - given MRI findings would further evaluate for demyelinating disease.   Check MRI of cervical and thoracic spine with and without contrast Schedule for LP to evaluate CSF cell count w/diff, protein, glucose, gram stain and culture, cytology, oligoclonal bands, IgG synthesis/index   Subjective:  Maria Lynch is a 41 year old female who follows up for weakness and muscle spasms.  She is accompanied by her husband.  MRI of brain personally reviewed.  UPDATE: Labs unremarkable, including negative ANA, negative RF, sed rate 12, CRP 2, B12 455, TSH 1.130, negative Lyme, D 25-hydroxy 50.6, Mg 2, CK 202, and aldolase 5.5.  She was unable to tolerate NCV-EMG which was therefore prematurely aborted.  Symptoms including weakness, pain and muscle spasms have progressed.  She has fallen twice.  Her PCP ordered an MRI of the brain with and without contrast which was performed on 10/04/2021 and showed small amount of nonspecific scattered hyperintense T2/FLAIR foci within the cerebral white matter including deep, juxtacortical and periventricular regions without abnormal contrast enhancement.     HISTORY: She has had progressively worsening left lateral hip pain since mid-2021.  She was diagnosed with left grater trochanteric bursitis and responded well to injection.  However, she has had other progressive symptoms not explained by the bursitis.  She notes weakness in all of her limbs.  Feet feel heavy.  She has difficulty standing from the floor or from a chair.  She has difficulty walking up and down stairs.  She reports loss of dexterity in her right hand.  Sometimes she will experience cramping in the right hand with spasms up the arm.  She was diagnosed with writer's cramp.  At night, she notes muscle spasms in her legs. She feels overall stiff, worse in the morning but improves later in the day.  Denies  generalized joint pain.  She feels off balance.  She still appears to limp on her left leg even though pain has resolved.  Sometimes she feels like she is slightly slurring her words.  No numbness or tingling.  Denies double vision and dysphagia.  She used to frequently run, including half marathons.  She stopped about 3 years ago due to causing hip pain.  She still likes to swim and perform yoga.  Bilateral hip X-rays on 11/13/2020 were unremarkable.  MRI of lumbar spine on 03/03/2021 personally reviewed showed mild multilevel facet arthropathy with central disc protrusion at L4-L5 and disc desiccation and disc height loss at L5-S1 but no significant canal or foraminal stenosis.     Denies family history of similar symptoms.  PAST MEDICAL HISTORY: Past Medical History:  Diagnosis Date   GERD (gastroesophageal reflux disease)    Prediabetes     MEDICATIONS: Current Outpatient Medications on File Prior to Visit  Medication Sig Dispense Refill   busPIRone (BUSPAR) 10 MG tablet Take 10 mg by mouth 2 (two) times daily.     cyclobenzaprine (FLEXERIL) 5 MG tablet TAKE 1 TABLET BY MOUTH DAILY AS NEEDED FOR MUSCLE SPASMS. 30 tablet 1   meloxicam (MOBIC) 15 MG tablet Take 1 tablet (15 mg total) by mouth daily. 30 tablet 2   sertraline (ZOLOFT) 50 MG tablet Take 50 mg by mouth daily.     No current facility-administered medications on file prior to visit.    ALLERGIES: No Known Allergies  FAMILY HISTORY: Family History  Problem Relation Age of Onset   Ovarian  cancer Maternal Grandmother    Diabetes Maternal Grandfather    Heart disease Paternal Grandmother       Objective:  *** General: No acute distress.  Patient appears ***-groomed.   Head:  Normocephalic/atraumatic Eyes:  Fundi examined but not visualized Neck: supple, no paraspinal tenderness, full range of motion Heart:  Regular rate and rhythm Lungs:  Clear to auscultation bilaterally Back: No paraspinal tenderness Neurological  Exam: alert and oriented to person, place, and time.  Speech fluent and not dysarthric, language intact.  CN II-XII intact. Bulk and tone normal, muscle strength 5/5 throughout.  Sensation to light touch intact.  Deep tendon reflexes 2+ throughout, toes downgoing.  Finger to nose testing intact.  Gait normal, Romberg negative.   Shon Millet, DO  CC: ***

## 2021-10-22 ENCOUNTER — Ambulatory Visit (INDEPENDENT_AMBULATORY_CARE_PROVIDER_SITE_OTHER): Payer: Commercial Managed Care - HMO | Admitting: Neurology

## 2021-10-22 ENCOUNTER — Encounter: Payer: Self-pay | Admitting: Neurology

## 2021-10-22 VITALS — BP 132/76 | HR 81 | Ht 63.0 in

## 2021-10-22 DIAGNOSIS — M6281 Muscle weakness (generalized): Secondary | ICD-10-CM | POA: Diagnosis not present

## 2021-10-22 DIAGNOSIS — R9089 Other abnormal findings on diagnostic imaging of central nervous system: Secondary | ICD-10-CM

## 2021-10-22 NOTE — Patient Instructions (Signed)
We will check MRI of cervical and thoracic spine with and without contrast.  A spinal tap may need to be performed as well. I would like you to see Dr. Despina Arias at [facility].  We will continue workup until that appointment.  Just in case, keep appointment at [facility].

## 2021-10-23 ENCOUNTER — Encounter: Payer: Self-pay | Admitting: Neurology

## 2021-10-23 NOTE — Progress Notes (Signed)
NEUROLOGY FOLLOW UP OFFICE NOTE  Maria Lynch [id number]  Assessment/Plan:   Muscle weakness/spasms, progressing - initial concern was for a neuromuscular disorder.  Findings on brain MRI now concerning for demyelinating disease such as MS.    She says she is unhappy with the communication from our office.  It appears that there has been some back and forth with missing phone calls and leaving messages.  I apologized to the patient and explained that we have tried to reach out by other means such as My Chart.  I also explained that I was unaware that her appointment at [facility] was scheduled out to October or that she was clinically progressing.  She is appreciative but would like to transfer care.  I think that is reasonable.   I will refer her to another neurologist for further care. So as not to delay further workup and eventual treatment, we will go ahead and order MRI of cervical and thoracic spine with and without contrast.  I explained that a lumbar puncture is likely as well.     Subjective:  Maria Lynch is a 41 year old female who follows up for weakness and muscle spasms.  MRI of brain personally reviewed.  UPDATE: Labs unremarkable, including negative ANA, negative RF, sed rate 12, CRP 2, B12 455, TSH 1.130, negative Lyme, D 25-hydroxy 50.6, Mg 2, CK 202, aldolase 5.5 and myotonic dystrophy panel.  She was unable to tolerate NCV-EMG which was therefore prematurely aborted.  Because she was unable to tolerate the EMG, I referred to [facility] to see if they can perform any other tests not available to me to establish a diagnosis.  Symptoms including weakness, pain and muscle spasms have progressed.  She has fallen twice.  Her PCP ordered an MRI of the brain with and without contrast which was performed on 10/04/2021 and showed small amount of nonspecific scattered hyperintense T2/FLAIR foci within the cerebral white matter including deep, juxtacortical and periventricular regions without  abnormal contrast enhancement.  Patient is upset today.  She feels that she is getting worse, now resorting to a cane.  Her appointment with [facility] isn't until October.  She reports that she is unsatisfied with our office.  Apparently there has been some difficulty with communication between office and patient.    HISTORY: She has had progressively worsening left lateral hip pain since mid-2021.  She was diagnosed with left grater trochanteric bursitis and responded well to injection.  However, she has had other progressive symptoms not explained by the bursitis.  She notes weakness in all of her limbs.  Feet feel heavy.  She has difficulty standing from the floor or from a chair.  She has difficulty walking up and down stairs.  She reports loss of dexterity in her right hand.  Sometimes she will experience cramping in the right hand with spasms up the arm.  She was diagnosed with writer's cramp.  At night, she notes muscle spasms in her legs. She feels overall stiff, worse in the morning but improves later in the day.  Denies generalized joint pain.  She feels off balance.  She still appears to limp on her left leg even though pain has resolved.  Sometimes she feels like she is slightly slurring her words.  No numbness or tingling.  Denies double vision and dysphagia.  She used to frequently run, including half marathons.  She stopped about 3 years ago due to causing hip pain.  She still likes to swim and perform  yoga.  Bilateral hip X-rays on 11/13/2020 were unremarkable.  MRI of lumbar spine on 03/03/2021 personally reviewed showed mild multilevel facet arthropathy with central disc protrusion at L4-L5 and disc desiccation and disc height loss at L5-S1 but no significant canal or foraminal stenosis.     Denies family history of similar symptoms.  PAST MEDICAL HISTORY: Past Medical History:  Diagnosis Date   GERD (gastroesophageal reflux disease)    Prediabetes     MEDICATIONS: Current Outpatient  Medications on File Prior to Visit  Medication Sig Dispense Refill   busPIRone (BUSPAR) 10 MG tablet Take 10 mg by mouth 2 (two) times daily.     cyclobenzaprine (FLEXERIL) 5 MG tablet TAKE 1 TABLET BY MOUTH DAILY AS NEEDED FOR MUSCLE SPASMS. 30 tablet 1   sertraline (ZOLOFT) 50 MG tablet Take 50 mg by mouth daily.     meloxicam (MOBIC) 15 MG tablet Take 1 tablet (15 mg total) by mouth daily. 30 tablet 2   No current facility-administered medications on file prior to visit.    ALLERGIES: No Known Allergies  FAMILY HISTORY: Family History  Problem Relation Age of Onset   Ovarian cancer Maternal Grandmother    Diabetes Maternal Grandfather    Heart disease Paternal Grandmother       Objective:  Blood pressure 132/76, pulse 81, height 5\' 3"  (1.6 m), SpO2 98 %. General: No acute distress.  Patient appears well-groomed.   Head:  Normocephalic/atraumatic Eyes:  Fundi examined but not visualized Neck: supple, no paraspinal tenderness, full range of motion Heart:  Regular rate and rhythm Lungs:  Clear to auscultation bilaterally Back: No paraspinal tenderness Neurological Exam: alert and oriented to person, place, and time.  Speech fluent and not dysarthric, language intact.  CN II-XII intact. Bulk and tone normal, muscle strength 4+/5 throughout, 3+/5 intrinsic hand muscles  Sensation to pinprick and vibration intact.  Deep tendon reflexes 2+ throughout, toes downgoing.  Finger to nose testing intact.  Broad-based gait, limp, uses cane for assistance. Romberg  negative.   , DO  CC: Shon Millet, MD

## 2021-11-09 ENCOUNTER — Ambulatory Visit
Admission: RE | Admit: 2021-11-09 | Discharge: 2021-11-09 | Disposition: A | Discharge status: Home / Self Care | Payer: Commercial Managed Care - HMO | Source: Ambulatory Visit | Attending: Neurology | Admitting: Neurology

## 2021-11-09 DIAGNOSIS — M6281 Muscle weakness (generalized): Secondary | ICD-10-CM

## 2021-11-09 DIAGNOSIS — R9089 Other abnormal findings on diagnostic imaging of central nervous system: Secondary | ICD-10-CM

## 2021-11-09 MED ORDER — GADOBENATE DIMEGLUMINE 529 MG/ML IV SOLN
16.0000 mL | Freq: Once | INTRAVENOUS | Status: AC | PRN
  Administered 2021-11-09: 16 mL via INTRAVENOUS

## 2021-11-11 ENCOUNTER — Telehealth: Payer: Self-pay

## 2021-11-11 DIAGNOSIS — M6281 Muscle weakness (generalized): Secondary | ICD-10-CM

## 2021-11-11 DIAGNOSIS — R9089 Other abnormal findings on diagnostic imaging of central nervous system: Secondary | ICD-10-CM

## 2021-11-11 NOTE — Telephone Encounter (Signed)
Tried calling patient appt for LP scheduled for 11/16/21 at 1 pm. Patient is to arrive at 12:45 pm.  LMOVM for patient as well sent a Mychart message

## 2021-11-11 NOTE — Telephone Encounter (Signed)
Patient advised of her MRI Cervical Spine.  Patient wants to go ahead and Schedule the LP before her appt with Dr.Sater.

## 2021-11-11 NOTE — Telephone Encounter (Signed)
-----   Message from Drema Dallas, DO sent at 11/10/2021  7:04 AM EDT ----- MRI of spinal cord is negative for evidence of MS.  As discussed, I would like to check a spinal tap - Check cell count, protein, glucose, gram stain and culture, cytology, IgG index and oligoclonal bands.

## 2021-11-12 ENCOUNTER — Telehealth: Payer: Self-pay | Admitting: Physician Assistant

## 2021-11-12 ENCOUNTER — Other Ambulatory Visit: Payer: Self-pay | Admitting: Physician Assistant

## 2021-11-12 ENCOUNTER — Other Ambulatory Visit: Payer: Self-pay | Admitting: Radiology

## 2021-11-12 MED ORDER — LORAZEPAM 2 MG PO TABS: 2.0000 mg | ORAL_TABLET | ORAL | 0 refills | Status: DC

## 2021-11-12 NOTE — Progress Notes (Signed)
Patient ID: Maria Lynch, female   DOB: [birth year], 41 y.o.   MRN: [id number]  Patient is scheduled for Lumbar Puncture for July 10. She is very anxious about the procedure and requested anxiety medication prior to.  I have called in Ativan 2 mg x 2 tablets, no refills.  She is to take one tablet one hour before arrival and bring the other with her to take before her procedure.  She understands she must have a driver and that the person who is with her agrees to sign consent for her procedure. She understands.    S  PA-C 11/12/2021 1:07 PM

## 2021-11-15 NOTE — Progress Notes (Signed)
[facility]  PATIENT: Maria Lynch DOB: Nov 25, 1980  REFERRING DOCTOR OR PCP: Charma Igo (neurology); Karle Plumber, MD (PCP) SOURCE: Patient, notes from Dr. Jacqulynn Cadet, laboratory and imaging reports, MRI images personally reviewed.  _________________________________   HISTORICAL  CHIEF COMPLAINT:  Chief Complaint  Patient presents with   New Patient (Initial Visit)    RM 2 with spouse. Ambulates with cane. Weakness mostly in right leg/arm. Cramping at night in legs. Gradually increasing in the past year and a half. No immediate family hx of MS.  She does trip over things causing falls. Hard to get back up due to the weakness. MRI's can be viewed in epic. Unable to walk without the cane. Tested for muscular dystrophy/lyme disease and negative. Has LP scheduled for 11/23/21. DrJaffe ordered this.     HISTORY OF PRESENT ILLNESS:  I had the pleasure of seeing your patient, Maria Lynch, at [facility] for neurologic consultation regarding her progressive weakness  She is a 41 year old woman who has had progressive weakness progressing over the last year.  In 2017, while in [place] with a lot of stress, she had swelling in her hands/forearms and felt general weakness.  Over the next 2 months or so, she improved.   She was also experiencing hip pain at that time.   She stopped running (used to do 1/2 marathons) due to the hip pain.    She would continue to get some aches in the wrists but less than initially.   In 2022, she began to get cramps in her legs >>arms, mostly in the calves.   This will occur nightly.   She was just prescribed diazepam qHS with mild benefit.    Additionally, she noted more weakness having difficulty standing from the floor or from a chair and having difficulties with climbing up stairs.  She saw orthopedics and was diagnosed with trochanteric bursitis.   Hip injections have helped the pain but not the weakness.  MRI of the lumbar  spine showed disc protrusion and mild facet hypertrophy at L4-L5 and facet hypertrophy at L5-S1 but nothing severe and no spinal stenosis or nerve root compression.  She saw Dr. Tomi Likens 04/24/2021 and there was felt to be mild weakness and she needed to use her upper body to rise from the chair.  NCV/EMG was ordered.  It showed normal median and ulnar sensory responses in the right arm and reduced amplitude for the median motor response.  Unfortunately, due to a panic attack the study was ended.  Lab work was normal except for minimally elevated creatinine kinase.  Symptoms persisted and myotonic dystrophy panel genetic test was negative.  MRI of the brain was performed 10/04/2021 and showed some periventricular foci raising concern about the possibility of MS.  MRI of the cervical and thoracic spine was performed 11/09/2021.  The spinal cord was normal and there was just mild degenerative changes but no spinal cord compression, spinal stenosis or nerve root compression.  Currently, she has right > left arm and left > right leg weakness and spasticity.      She can only walk 10-20 feet without her cane.  She feels she will fall backwards.     She has trouble getting put of chairs and uses the arms.   She needs to pull herself up stairs.  She notes twitching in her Sensation is fine.   Bladder function is fine.   Viion is unchanged.  When she is tired she notes some  slurring o words.  She has occasional mild swallowing issues but these are old.    She does not have any cognitive changes.    She has lost 5-10 pounds.   She feels more anxious and depressed due to these issues.   She feels more tired.  .      Vascular risks:   No HTN, DM (was pre-diabetic for awhile  DATA Imaging review: MRI of the lumbar spine 03/03/2021 showed facet hypertrophy and disc protrusion at L4-L5 but no spinal stenosis or nerve root compression.  There was facet hypertrophy at L5-S1.  MRI of the brain 10/04/2021 shows some T2/FLAIR  hyperintense foci in the periventricular and deep white matter, left greater than right.  Normal enhancement pattern.  This is nonspecific and could be due to demyelination or chronic microvascular ischemic changes.  MRI of the cervical and thoracic spine 11/09/2021 shows a normal spinal cord before and after contrast.  There are some small cervical disc bulges/protrusions but no spinal stenosis or nerve root compression.  Labs: 04/24/2021: Creatinine kinase was borderline increased (not clinically significant).  TSH, ESR, rheumatoid factor, CRP, B12 and aldolase were normal.  Genetic panel for myotonic dystrophy was negative by report  09/17/2021: Lyme disease Western blot negative  NCV/EMG 05/28/2021 (Dr. Posey Pronto).  Limited NCV showed normal median and ulnar sensory responses.  The right median motor response had a reduced amplitude (1.9 mV at both the wrist and elbow).  Due to anxiety, patient requested to and the study and and no EMG was performed.  REVIEW OF SYSTEMS: Constitutional: No fevers, chills, sweats, or change in appetite Eyes: No visual changes, double vision, eye pain Ear, nose and throat: No hearing loss, ear pain, nasal congestion, sore throat Cardiovascular: No chest pain, palpitations Respiratory:  No shortness of breath at rest or with exertion.   No wheezes GastrointestinaI: No nausea, vomiting, diarrhea, abdominal pain, fecal incontinence Genitourinary:  No dysuria, urinary retention or frequency.  No nocturia. Musculoskeletal:  No neck pain, back pain Integumentary: No rash, pruritus, skin lesions Neurological: as above Psychiatric: No depression at this time.  No anxiety Endocrine: No palpitations, diaphoresis, change in appetite, change in weigh or increased thirst Hematologic/Lymphatic:  No anemia, purpura, petechiae. Allergic/Immunologic: No itchy/runny eyes, nasal congestion, recent allergic reactions, rashes  ALLERGIES: No Known Allergies  HOME  MEDICATIONS:  Current Outpatient Medications:    busPIRone (BUSPAR) 10 MG tablet, Take 10 mg by mouth 2 (two) times daily., Disp: , Rfl:    cyclobenzaprine (FLEXERIL) 5 MG tablet, TAKE 1 TABLET BY MOUTH DAILY AS NEEDED FOR MUSCLE SPASMS., Disp: 30 tablet, Rfl: 1   diazepam (VALIUM) 5 MG tablet, Take 1 tablet (5 mg total) by mouth every 12 (twelve) hours as needed for anxiety., Disp: 60 tablet, Rfl: 3   LORazepam (ATIVAN) 2 MG tablet, Take 1 tablet (2 mg total) by mouth every hour. Take one tablet by mouth one hour before arrival for procedure. Bring the other tablet with you to be taken my mouth prior to procedure., Disp: 2 tablet, Rfl: 0   sertraline (ZOLOFT) 50 MG tablet, Take 50 mg by mouth daily., Disp: , Rfl:   PAST MEDICAL HISTORY: Past Medical History:  Diagnosis Date   GERD (gastroesophageal reflux disease)    Prediabetes     PAST SURGICAL HISTORY: Past Surgical History:  Procedure Laterality Date   WISDOM TOOTH EXTRACTION      FAMILY HISTORY: Family History  Problem Relation Age of Onset   Ovarian cancer  Maternal Grandmother    Diabetes Maternal Grandfather    Heart disease Paternal Grandmother     SOCIAL HISTORY:  Social History   Socioeconomic History   Marital status: Soil scientist    Spouse name: Not on file   Number of children: 0   Years of education: Not on file   Highest education level: Doctorate  Occupational History   Occupation: minister  Tobacco Use   Smoking status: Never   Smokeless tobacco: Never  Vaping Use   Vaping Use: Never used  Substance and Sexual Activity   Alcohol use: Yes    Alcohol/week: 8.0 - 10.0 standard drinks of alcohol    Types: 8 - 10 Cans of beer per week   Drug use: Yes    Comment: edible marijuana   Sexual activity: Not on file  Other Topics Concern   Not on file  Social History Narrative   Right handed   One story home   Drinks caffeine   Social Determinants of Health   Financial Resource Strain: Not on  file  Food Insecurity: Not on file  Transportation Needs: Not on file  Physical Activity: Not on file  Stress: Not on file  Social Connections: Not on file  Intimate Partner Violence: Not on file     PHYSICAL EXAM  Vitals:   11/16/21 0902  BP: 135/89  Pulse: 88  Weight: 161 lb 3.2 oz (73.1 kg)  Height: 5' 3" (1.6 m)    Body mass index is 28.56 kg/m.   General: The patient is well-developed and well-nourished and in no acute distress  HEENT:  Head is [place]/AT.  Sclera are anicteric.  Funduscopic exam shows normal optic discs and retinal vessels.  Neck: No carotid bruits are noted.  The neck is nontender.  Cardiovascular: The heart has a regular rate and rhythm with a normal S1 and S2. There were no murmurs, gallops or rubs.    Skin: Extremities are without rash or  edema.  Musculoskeletal:  Back is nontender  Neurologic Exam  Mental status: The patient is alert and oriented x 3 at the time of the examination. The patient has apparent normal recent and remote memory, with an apparently normal attention span and concentration ability.   Speech is normal.  Cranial nerves: Extraocular movements are full. Pupils are equal, round, and reactive to light and accomodation.  Visual fields are full.  Facial symmetry is present. There is good facial sensation to soft touch bilaterally.Facial strength is normal.  Trapezius and sternocleidomastoid strength is normal. No dysarthria is noted.  The tongue is midline, and the patient has symmetric elevation of the soft palate.  Possible fasciculation in  tongue.  No obvious hearing deficits are noted.  Motor: There is atrophy of the first dorsal interosseous muscle on the right.  A couple fasciculations were noted there.  A couple fasciculations were also noted in the right quadriceps.  Muscle tone was fairly normal. Strength (R/L) Deltoid 4+/4+ Biceps 5/5 Triceps 4/4+ Wrist extensors 2/4+ Wrist flexors 4+/5 APD 2-/4 First DIO  2/4  Iliopsoas 4+/4- Quadriceps 5/4 Hamstrings 5/4+ Gastrocnemius 5/4 Ankle extension 4+/2 Toe extension 4+/2-  Sensory: Sensory testing is intact to pinprick, soft touch and vibration sensation in all 4 extremities.  Coordination: Cerebellar testing reveals good finger-nose-finger and heel-to-shin bilaterally.  Gait and station: She needs to use her hands to get up from the chair but once up, Station is normal.   Gait is poor with a left foot drop and  she cannot do a tandem gait.   Romberg is negative.   Reflexes: Deep tendon reflexes are 3 and fairly symmetric in the arms, 3+ at the knees, left greater than right and 3+ at the ankles with 1-2 beats of clonus on the left.  Plantar responses are flexor.    DIAGNOSTIC DATA (LABS, IMAGING, TESTING) - I reviewed patient records, labs, notes, testing and imaging myself where available.  Lab Results  Component Value Date   WBC 9.0 11/04/2020   HGB 13.9 11/04/2020   HCT 41.4 11/04/2020   MCV 91 11/04/2020   PLT 324 11/04/2020      Component Value Date/Time   NA 138 11/04/2020 0957   K 4.7 11/04/2020 0957   CL 101 11/04/2020 0957   CO2 20 11/04/2020 0957   GLUCOSE 89 11/04/2020 0957   BUN 12 11/04/2020 0957   CREATININE 0.73 11/04/2020 0957   CALCIUM 9.9 11/04/2020 0957   PROT 7.2 11/04/2020 0957   ALBUMIN 4.3 11/04/2020 0957   AST 16 11/04/2020 0957   ALT 20 11/04/2020 0957   ALKPHOS 63 11/04/2020 0957   BILITOT 0.3 11/04/2020 0957   Lab Results  Component Value Date   CHOL 186 11/04/2020   HDL 54 11/04/2020   LDLCALC 110 (H) 11/04/2020   TRIG 121 11/04/2020   CHOLHDL 3.4 11/04/2020   Lab Results  Component Value Date   HGBA1C 5.6 11/04/2020   Lab Results  Component Value Date   VITAMINB12 455 04/24/2021   Lab Results  Component Value Date   TSH 1.130 04/24/2021       ASSESSMENT AND PLAN  Weakness of both lower limbs - Plan: GM1 IgG Autoantibodies, Anti-Hu, Anti-Ri, Anti-Yo IFA, CK, NCV with  EMG(electromyography)  Weakness of both arms - Plan: NCV with EMG(electromyography)  Fasciculations - Plan: NCV with EMG(electromyography)  Atrophy of muscle of multiple sites - Plan: NCV with EMG(electromyography)  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 Overholt is a 41 year old woman with progressive weakness most involving the right arm and left leg for the past 1 to 2 years.  On examination today, she had some atrophy in the first dorsal interosseous on the right and had moderate weakness in the right arm and left leg with milder weakness on the opposite sides.  She has had increased reflexes.  Her imaging studies showed normal spinal cord.  She did have a couple periventricular foci on the brain MRI.  Although this raises a little concern for MS, though small lesions would be asymptomatic and cannot explain her presentation.  She is scheduled to have a lumbar puncture and will proceed with that.  Based on the results, additional evaluation or treatment will be initiated.  She was unable to complete an NCV/EMG study due to a panic attack but the motor response had a reduced amplitude.   I am most concerned about the possibility of amyotrophic lateral sclerosis.  Therefore I will see if we can get an NCV/EMG study.  She is advised to take a Valium before the study.  I will also check anti-GM1 IgG (multifocal motor neuropathy is an ALS mimic) and paraneoplastic antibodies and creatinine kinase.  Other mimics of ALS such as multilevel spinal stenosis in the cervical spine are ruled out by imaging.  We will let her know the results of the studies.  I refilled her diazepam with several refills.  If she needs something more for spasticity at night baclofen can be added.  She would like to get an additional consultation at [facility].  She has an appointment for October.  If she does have ALS we will try to see if we can get that moved up.  If we are unable to get it rapidly  moved up I would initiate therapy.  Thank you for asking me to see Ms. Punt.  Please let her know I will be of further assistance with her or other patients in the future.      A. Felecia Shelling, MD, [facility] 12/22/4164, 0:63 PM Certified in Neurology, Clinical Neurophysiology, Sleep Medicine and Neuroimaging  [facility] [address] [place], [place] [address] [phone]

## 2021-11-16 ENCOUNTER — Ambulatory Visit: Payer: Commercial Managed Care - HMO | Admitting: Neurology

## 2021-11-16 ENCOUNTER — Encounter: Payer: Self-pay | Admitting: Neurology

## 2021-11-16 ENCOUNTER — Ambulatory Visit (HOSPITAL_COMMUNITY): Admission: RE | Admit: 2021-11-16 | Payer: Commercial Managed Care - HMO | Source: Ambulatory Visit

## 2021-11-16 VITALS — BP 135/89 | HR 88 | Ht 63.0 in | Wt 161.2 lb

## 2021-11-16 DIAGNOSIS — R253 Fasciculation: Secondary | ICD-10-CM | POA: Diagnosis not present

## 2021-11-16 DIAGNOSIS — R29898 Other symptoms and signs involving the musculoskeletal system: Secondary | ICD-10-CM | POA: Diagnosis not present

## 2021-11-16 DIAGNOSIS — M6259 Muscle wasting and atrophy, not elsewhere classified, multiple sites: Secondary | ICD-10-CM

## 2021-11-16 DIAGNOSIS — G1220 Motor neuron disease, unspecified: Secondary | ICD-10-CM | POA: Diagnosis not present

## 2021-11-16 MED ORDER — DIAZEPAM 5 MG PO TABS: 5.0000 mg | ORAL_TABLET | Freq: Two times a day (BID) | ORAL | 3 refills | Status: DC | PRN

## 2021-11-17 ENCOUNTER — Encounter (INDEPENDENT_AMBULATORY_CARE_PROVIDER_SITE_OTHER): Payer: Self-pay

## 2021-11-19 ENCOUNTER — Telehealth: Payer: Self-pay

## 2021-11-19 ENCOUNTER — Encounter: Payer: Self-pay | Admitting: Neurology

## 2021-11-19 ENCOUNTER — Ambulatory Visit (INDEPENDENT_AMBULATORY_CARE_PROVIDER_SITE_OTHER): Payer: Commercial Managed Care - HMO | Admitting: Neurology

## 2021-11-19 ENCOUNTER — Ambulatory Visit: Payer: Self-pay | Admitting: Neurology

## 2021-11-19 VITALS — BP 128/94 | HR 82 | Ht 63.0 in

## 2021-11-19 DIAGNOSIS — G1220 Motor neuron disease, unspecified: Secondary | ICD-10-CM | POA: Diagnosis not present

## 2021-11-19 DIAGNOSIS — R253 Fasciculation: Secondary | ICD-10-CM | POA: Diagnosis not present

## 2021-11-19 DIAGNOSIS — G1221 Amyotrophic lateral sclerosis: Secondary | ICD-10-CM | POA: Insufficient documentation

## 2021-11-19 DIAGNOSIS — R29898 Other symptoms and signs involving the musculoskeletal system: Secondary | ICD-10-CM | POA: Insufficient documentation

## 2021-11-19 DIAGNOSIS — M6259 Muscle wasting and atrophy, not elsewhere classified, multiple sites: Secondary | ICD-10-CM | POA: Insufficient documentation

## 2021-11-19 DIAGNOSIS — Z0289 Encounter for other administrative examinations: Secondary | ICD-10-CM

## 2021-11-19 LAB — ANTI-HU, ANTI-RI, ANTI-YO IFA
Anti-Hu Ab: NEGATIVE
Anti-Ri Ab: NEGATIVE
Anti-Yo Ab: NEGATIVE

## 2021-11-19 LAB — INTERPRETATION

## 2021-11-19 LAB — GM1 IGG AUTOANTIBODIES: GM1 IgG Autoantibodies: <20 % (ref 0–30)

## 2021-11-19 LAB — NEUROFILAMENT LIGHT CHAIN: Neurofilament Light Chain: 7.7 pg/mL — ABNORMAL HIGH (ref 0.00–2.13)

## 2021-11-19 LAB — CK: Total CK: 221 U/L — ABNORMAL HIGH (ref 32–182)

## 2021-11-19 NOTE — Procedures (Signed)
Full Name: Maria Lynch Gender: Female MRN #: [id number] Date of Birth: December 24, 1980    Visit Date: 11/19/2021 16:28 Age: 41 Years Examining Physician: Levert Feinstein Referring Physician: Despina Arias Height: 5 feet 3 inch History: 41 year old female presented with painless muscle atrophy involving bilateral lower extremity, upper extremity, bulbar  Summary of the test: Nerve conduction study: Bilateral sural, superficial peroneal sensory responses were normal.  Left tibial motor responses are normal.  Right peroneal to EDB and ulnar motor response showed mildly decreased CMAP amplitude, no evidence of conduction block.  Right median nerve showed significantly decreased CMAP amplitude   Electromyography: Selected needle examination was performed at bilateral lower extremity muscles, right upper extremity muscles; right sternocleidomastoid; right genioglossus; right thoracic paraspinal muscles.  There is evidence of active denervation, chronic neuropathic changes involving bilateral lumbosacral myotomes, right cervical myotomes.  There is also evidence of increased insertional activity, mild active denervation at right mid and lower thoracic paraspinals.  There is chronic neuropathic changes at right sternocleidomastoid muscles.  There is increased insertional activity, occasional fasciculation, active denervation at right lateral genioglossus, mixture of normal and enlarged complex motor unit potential with decreased recruitment.   Conclusion: This is an  abnormal study.  There is electrodiagnostic evidence of widespread active/chronic neuropathic changes involving bilateral lumbosacral, right cervical myotomes.  There is also evidence of active/chronic neuropathic changes involving the right genioglossus, sternocleidomastoid, thoracic paraspinal muscles.  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   ------------------------------- Levert Feinstein, M.D. PhD  [facility] [address], [address] [place], [place] [address] Tel: [phone] Fax: [phone]  Verbal informed consent was obtained from the patient, patient was informed of potential risk of procedure, including bruising, bleeding, hematoma formation, infection, muscle weakness, muscle pain, numbness, among others.        MNC    Nerve / Sites Muscle Latency Ref. Amplitude Ref. Rel Amp Segments Distance Velocity Ref. Area    ms ms mV mV %  cm m/s m/s mVms  R Median - APB     Wrist APB 2.5 ?4.4 0.3 ?4.0 100 Wrist - APB 7   1.3     Upper arm APB 8.8  0.8  277 Upper arm - Wrist   ?49 2.9  R Ulnar - ADM     Wrist ADM 4.3 ?3.3 3.4 ?6.0 100 Wrist - ADM 10   12.6     B.Elbow ADM 6.1  3.3  97.2 B.Elbow - Wrist 10 58 ?49 12.3     A.Elbow ADM 8.2  3.3  100 A.Elbow - B.Elbow 12 56 ?49 12.4  R Peroneal - EDB     Ankle EDB 6.0 ?6.5 2.0 ?2.0 100 Ankle - EDB 9   8.7     Fib head EDB 13.1  1.5  74.6 Fib head - Ankle 35 49 ?44 5.2     Pop fossa EDB 15.0  1.3  87 Pop fossa - Fib head 8 43 ?44 3.1         Pop fossa - Ankle      L Tibial - AH     Ankle AH 4.4 ?5.8 14.2 ?4.0 100 Ankle - AH 9   30.7     Pop fossa AH 13.3  10.7  75.4 Pop fossa - Ankle 38 43 ?41 26.4             SNC  Nerve / Sites Rec. Site Peak Lat Ref.  Amp Ref. Segments Distance    ms ms V V  cm  R Sural - Ankle (Calf)     Calf Ankle 3.6 ?4.4 23 ?6 Calf - Ankle 14  L Sural - Ankle (Calf)     Calf Ankle 3.8 ?4.4 19 ?6 Calf - Ankle 14  R Superficial peroneal - Ankle     Lat leg Ankle 3.5 ?4.4 22 ?6 Lat leg - Ankle 14  L Superficial peroneal - Ankle     Lat leg Ankle 3.7 ?4.4 13 ?6 Lat leg - Ankle 14  R Median - Orthodromic (Dig II, Mid palm)     Dig II Wrist 2.9 ?3.4 29 ?10 Dig II - Wrist 13  R Ulnar - Orthodromic, (Dig V, Mid palm)     Dig V Wrist 2.8 ?3.1 26 ?5 Dig V - Wrist 61                 F  Wave    Nerve F Lat Ref.    ms ms  R Ulnar - ADM 28.6 ?32.0  L Tibial - AH 49.7 ?56.0         EMG Summary Table    Spontaneous MUAP Recruitment  Muscle IA Fib PSW Fasc Other Amp Dur. Poly Pattern  R. Tibialis anterior Increased None None None _______ Increased Increased 1+ Reduced  R. Peroneus longus Increased 1+ None None _______ Increased Increased 1+ Reduced  R. Vastus lateralis Increased None None None _______ Increased Increased 1+ Reduced  L. Tibialis anterior Increased 1+ 1+ None _______ Increased Increased 1+ Reduced  L. Peroneus longus Increased 1+ None None _______ Increased Increased 1+ Reduced  L. Vastus lateralis Increased 1+ None None _______ Increased Increased 1+ Reduced  R. Thoracic paraspinals (mid) Increased 1+ None None _______ Normal Normal Normal Normal  R. Thoracic paraspinals (low) Increased 1+ None None _______ Normal Normal Normal Normal  R. First dorsal interosseous Increased 2+ None None _______ Increased Increased 1+ Reduced  R. Biceps brachii Increased 1+ 1+ Occasional _______ Normal Increased 1+ Reduced  R. Extensor digitorum communis Increased 1+ None None _______ Increased Increased 1+ Reduced  R. Sternocleidomastoid Increased None None None _______ Normal Normal Normal Reduced  R. Genioglossus Increased 1+ None None _______ Increased Increased Normal Reduced

## 2021-11-19 NOTE — Progress Notes (Deleted)
Patient tripped on the way to the exam room. Mild abrasion to right knee and right elbow. No broken skin. Dressing applied to knee along with ice packs to both areas. Mild soreness in palms, but no obvious injury. Incident report filed with [facility]. 

## 2021-11-19 NOTE — Progress Notes (Addendum)
ASSESSMENT AND PLAN  Maria Lynch is a 41 y.o. 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 AddendumChesley Lynch amyotrophic lateral sclerosis advanced evaluation panel on December 08, 2021 was reported negative  17 genes was tested including SOD  MEDICAL HISTORY:  Maria Lynch is a 41 year old female, seen in request by Dr. Felecia Shelling for evaluation of muscle weakness, EMG nerve conduction study, she is accompanied by her significant other's at today's visit November 19, 2021   I reviewed and summarized the referring note. PMHX Anxiety GERD  At the beginning of 2022, she noticed right hand weakness, later also left leg weakness, no sensory loss, muscle weakness continue to progress, to the point, she cannot write with her right hand, left foot drop, frequent falling,  Initially she also had some left hip pain, received a cortisone injection to her left hip, which did help her pain, but muscle weakness continued to progress relentlessly  She work as a Theme park manager at [employer], noticed mild slurred speech over the past few months  There is no memory loss, no family history  Personally reviewed MRI of the brain, small amount of T2 hyperintensity lesion at the left periventricular region, no contrast-enhancement  MRI of thoracic spine no acute abnormality, cervical spine mild degenerative changes no evidence of significant  canal foraminal narrowing  MRI lumbar mild degenerative changes, no evidence of canal or foraminal narrowing   Laboratory evaluations since 2022, negative paraneoplastic, anti-GM1, Lyme titer, ANA, aldolase, B12, rheumatoid factor, C-reactive protein, vitamin D, TSH, ESR, CMP, CBC, A1c, mild elevation CPK 221, LDL 110, neurofilament light chain  EMG nerve conduction study today showed well-preserved upper and lower extremity sensory response, no evidence of motor conduction block, widespread active neuropathic changes involving bilateral lumbosacral, right cervical myotomes, there is also evidence of chronic neuropathic changes involving the right genioglossus, sternocleidomastoid, thoracic paraspinal muscles.  Above findings supported diagnosis of motor neuron disease  PHYSICAL EXAM:    Blood pressure 128/94, heart rate of 82, height 5 3,    PHYSICAL EXAMNIATION:  Gen: NAD, conversant, well nourised, well groomed                     Cardiovascular: Regular rate rhythm, no peripheral edema, warm, nontender. Eyes: Conjunctivae clear without exudates or hemorrhage Neck: Supple, no carotid bruits. Pulmonary: Clear to auscultation bilaterally   NEUROLOGICAL EXAM:  MENTAL STATUS: Speech/cognition: Awake, alert, oriented to history taking and casual conversation, mild slurred speech CRANIAL NERVES: CN II: Visual fields are full to confrontation. Pupils are round equal and briskly reactive to light. CN III, IV, VI: extraocular movement are normal. No ptosis. CN V: Facial sensation is intact to light touch CN VII: Face is symmetric with normal eye closure, with mild cheek puff, tongue protrusion and cheek weakness CN VIII: Hearing is normal to causal conversation. CN IX, X: Phonation is normal. CN XI: Head turning and shoulder shrug are intact CN XII: Tongue fasciculation, mild lateral atrophy  MOTOR: Noticeable atrophy of right hand, biceps fasciculations, muscle strength as follows  R/L Shoulder abduction3/4, elbow flexion3/3 elbow extension,3/4, wrist extension3/5, wrist flexion3/4, grip 3/4 Hip flexion4/4, knee flexion5/5, knee extension5/5, ankle dorsiflexion4/2, ankle plantarflexion4+/3  REFLEXES: Reflexes are 3 and symmetric at the biceps, triceps, knees, and ankles. Plantar responses are extensor bilaterally, with bilateral Hoffmann sign  SENSORY: Intact to light touch, pinprick and vibratory sensation are intact in fingers and toes.  COORDINATION: There is no trunk or limb dysmetria noted.  GAIT/STANCE: Need push-up to get up from seated position, bilateral foot drop, left worse than right, unsteady wide-based  REVIEW OF SYSTEMS:  Full 14 system review of systems performed and notable only for as above All other review of systems were negative.   ALLERGIES: Allergies  Allergen Reactions   Other Shortness Of Breath and Swelling    Peach fuzz causes throat swelling   Spinach Shortness Of Breath and Swelling    Throat swells     HOME MEDICATIONS: Current Outpatient Medications  Medication Sig Dispense Refill   busPIRone (BUSPAR) 10 MG tablet Take 10 mg by mouth 2 (two) times daily.     Cyanocobalamin (B-12 PO) Take 2 capsules by mouth daily.     diazepam (VALIUM) 5 MG tablet Take 1 tablet (5 mg total) by mouth every 12 (twelve) hours as needed for anxiety. (Patient taking differently: Take 5 mg by mouth at bedtime.) 60 tablet 3   famotidine (PEPCID) 20 MG tablet Take 20 mg by mouth daily.     fexofenadine (ALLEGRA) 180 MG tablet Take 180 mg by mouth daily as needed for allergies or rhinitis.     ibuprofen (ADVIL) 200 MG tablet Take 400 mg by mouth every 6 (six) hours as needed for headache or cramping.     LORazepam (ATIVAN) 2 MG tablet Take 1 tablet (2 mg total) by mouth every hour. Take one tablet by mouth one hour before arrival for procedure. Bring the other tablet with you to be taken my mouth prior to procedure. 2 tablet 0   Multiple  Vitamins-Minerals (ADULT GUMMY) CHEW Chew 2 capsules by mouth daily.     sertraline (ZOLOFT) 50 MG tablet Take 50 mg by mouth daily.     No current facility-administered medications for this visit.    PAST MEDICAL HISTORY: Past Medical History:  Diagnosis Date   GERD (gastroesophageal reflux disease)    Prediabetes     PAST SURGICAL HISTORY: Past Surgical History:  Procedure Laterality Date   WISDOM TOOTH EXTRACTION      FAMILY HISTORY: Family History  Problem Relation Age of Onset   Ovarian cancer Maternal Grandmother    Diabetes Maternal Grandfather    Heart disease Paternal Grandmother     SOCIAL HISTORY: Social History   Socioeconomic History   Marital status: Soil scientist    Spouse name: Not on file   Number of children: 0   Years of education: Not on file   Highest education level: Doctorate  Occupational History   Occupation: minister  Tobacco Use   Smoking status: Never   Smokeless tobacco: Never  Vaping Use   Vaping Use: Never used  Substance and Sexual Activity   Alcohol use: Yes    Alcohol/week: 8.0 - 10.0 standard drinks of alcohol    Types: 8 - 10 Cans of beer per week   Drug use: Yes    Comment: edible marijuana   Sexual activity: Not on file  Other Topics Concern   Not on  file  Social History Narrative   Right handed   One story home   Drinks caffeine   Social Determinants of Health   Financial Resource Strain: Not on file  Food Insecurity: Not on file  Transportation Needs: Not on file  Physical Activity: Not on file  Stress: Not on file  Social Connections: Not on file  Intimate Partner Violence: Not on file      Marcial Pacas, M.D. Ph.D.  [facility] [address], [place], [place] [address] Ph: [phone] Fax: [phone]  CC:  Ladell Pier, MD [address] [place],  [place] [address]  Ladell Pier, MD

## 2021-11-19 NOTE — Patient Instructions (Signed)
Mckinley Jewel, MD [email or web address] Address: [address] [place], [place] [address] Phone: [phone]

## 2021-11-19 NOTE — Telephone Encounter (Signed)
Patient tripped on the way to the exam room. Mild abrasion to right knee and right elbow. No broken skin. Dressing applied to knee along with ice packs to both areas. Mild soreness in palms, but no obvious injury. Incident report filed with [facility].

## 2021-11-20 ENCOUNTER — Telehealth: Payer: Self-pay | Admitting: Neurology

## 2021-11-20 MED ORDER — BACLOFEN 10 MG PO TABS: ORAL_TABLET | ORAL | 0 refills | Status: AC

## 2021-11-20 MED ORDER — RILUZOLE 50 MG PO TABS: 50.0000 mg | ORAL_TABLET | Freq: Two times a day (BID) | ORAL | 11 refills | Status: AC

## 2021-11-20 NOTE — Telephone Encounter (Signed)
Pt is calling demanding to talk to Dr. Epimenio Foot stated it was about some test results and she needed to talk to him today. I explained to Pt that I would put a message in the system and someone would reach out to her and it would probably be Monday. Pt was really upset and said I know you have his cell phone number and you need to use it. I explain again someone would be in contact with her.

## 2021-11-20 NOTE — Telephone Encounter (Signed)
I called about 615 pm 11/19/2021 and 915 am 11/20/2021 and got voicemail.  I left a message and will try again later this morning  EMG/NCV is suggestive of motor neuron disease/ALS  I would like to start Rilutek, Radicava and refer to ALS center - I concur with the patient's desire to see Dr. Zannie Kehr at [facility] (Dr. Dierdre Searles is also an ALS specialist in the same clinic)

## 2021-11-20 NOTE — Telephone Encounter (Signed)
I was able to reach Maria Lynch.  I went over the results of the EMG.  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 twice daily Begin the process to get her started on Radicava Refer to the [facility]-Dr. Bedlack Check the [facility] genetic profile for ALS Baclofen for spasticity

## 2021-11-23 ENCOUNTER — Ambulatory Visit: Payer: 59 | Admitting: Neurology

## 2021-11-23 ENCOUNTER — Telehealth: Payer: Self-pay | Admitting: *Deleted

## 2021-11-23 ENCOUNTER — Encounter (HOSPITAL_COMMUNITY): Payer: Self-pay

## 2021-11-23 ENCOUNTER — Inpatient Hospital Stay (HOSPITAL_COMMUNITY): Admission: RE | Admit: 2021-11-23 | Payer: Commercial Managed Care - HMO | Source: Ambulatory Visit

## 2021-11-23 NOTE — Telephone Encounter (Signed)
Faxed completed/signed start form for Radicava oral 105mg  (83ml) oral suspension to 4m at [phone]. Received fax confirmation.  Starter dose: Administer 105mg  (18ml) orally or via feeding tube once daily for 14 consecutive days, followed by a 14-day drug free period. Qty: 23ml, no refills. Subsequent dose: administer 105mg  (80ml) orally or via feeding tube once daily for 10 days our of 14 days, followed by 14-day drug free periods. Qty: 53ml, refills: 11.

## 2021-11-24 NOTE — Telephone Encounter (Signed)
Called pt since she has not read [employer]. She has 2 people in household, annual income about: 60,000. I placed on form for [facility]. Faxed to [place] at [phone]. Received fax confirmation. She has worked with them before and aware of process. She will call if she has any questions/concerns moving forward.

## 2021-11-25 ENCOUNTER — Telehealth: Payer: Self-pay | Admitting: Neurology

## 2021-11-25 NOTE — Telephone Encounter (Signed)
Referral for Neurology sent to [facility] Dr. Zannie Kehr [phone].

## 2021-11-26 NOTE — Telephone Encounter (Addendum)
Initiated PA for starter dose on CMM. Key: [id number] - PA Case ID: [id number]. In process of completing.   Submitted PA 1304. Waiting on determination from [place]. Marked Urgent.

## 2021-11-26 NOTE — Telephone Encounter (Signed)
Called [facility] at [phone], option 1. Spoke w/ Marisue Ivan. Confirmed they received order form faxed 11/24/21. Has not been processed yet. She reviewed and fax they received was dark and unable to read insurance ID. Confirmed the copy we sent was clear and a good copy. 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  I provided insurance info over the phone. She was able to pull up pt. They will contact pt at this point, nothing further needed from our office.

## 2021-12-01 ENCOUNTER — Other Ambulatory Visit: Payer: Self-pay | Admitting: Neurology

## 2021-12-01 ENCOUNTER — Telehealth: Payer: Self-pay | Admitting: Neurology

## 2021-12-01 DIAGNOSIS — G1221 Amyotrophic lateral sclerosis: Secondary | ICD-10-CM

## 2021-12-01 NOTE — Addendum Note (Signed)
Addended by: Arther Abbott on: 12/01/2021 01:58 PM   Modules accepted: Orders

## 2021-12-01 NOTE — Telephone Encounter (Signed)
Radicava was denied by [place].  They require additional information, specifically: Indication that patient has retained functionality for most activities of daily living (defined the scores of 2 points or better on each individual item of the ALS FRS and minimum score of 24)     She meets this criteria.   Her scores are below:  Based on history and physical examination 11/14/2021 Revised Amyotrophic Lateral Sclerosis Functional Rating Scale (ALSFRS-R) from [email or web address]  on 12/01/2021  RESULT SUMMARY: 40 points ALSFRS-R  INPUTS: Speech --> 3 = Detectable speech disturbance Salivation --> 4 = Normal Swallowing --> 4 = Normal eating habits Handwriting --> 3 = Slow or sloppy; all words are legible Patients with gastrostomy and >50% daily nutrition intake via G-tube --> 0 = No Cutting food and handling utensils --> 3 = Somewhat slow and clumsy but no help needed Cutting food and handling utensils --> 4 = Normal Dressing and hygiene --> 3 = Independent and complete self-care with effort or decreased efficiency Turning in bed and adjusting bed clothes --> 4 = Normal Walking --> 2 = Walks with assistance Climbing stairs --> 2 = Mild unsteadiness or fatigue Dyspnea --> 4 = None Orthopnea --> 4 = None Respiratory insufficiency --> 4 = None  2.    No indication the patient has normal respiratory function defined as 80% or better forced vital capacity ----   we will obtain pulmonary function test.   Other [employer] criteria for Radicava already met: She meets the revised [place] criteria for Definite ALS based on physical exam and EMG.  She has a disease duration of less than 2 years  Medication is being prescribed by a neurologist trained and neuromuscular disease.

## 2021-12-01 NOTE — Telephone Encounter (Signed)
Called [facility] at [phone] to see if they could get pt in for spirometry test this week per Dr. Bonnita Hollow request. They are open Mon-Fri 8a-5pm and open during lunch. I tried calling three times. Was on hold each time to speak with a rep for extended period of time twice. Third time I as able to speak with rep. They only have 1 working machine right now. Part should come in Thursday to fix broken one. They have opening Friday at 2:30p or 4:30pm ( ) that pt can take. I placed her on hold and called pt. She is unable to accept, going to [facility] Friday. Asked about appt next week. I placed her on hold. Pulmonology does not have any openings next week. I relayed to pt that I would call around to see if another location can get her in this week or next and then will call her.  Pt can do Monday, Tuesday, Wednesday (mornings) of next week.   Per [facility], can call [facility] or [facility] respiratory department about getting pt scheduled. I called [facility] and spoke w/ Almira Coaster Administrator, arts) asked to be connected to respiratory department. Transferred but automated system hung up. I called back. Spoke w/ rep. Iantha Fallen. This was [facility], not [facility]. Advised me to call [facility] at [phone]. Spoke w/ Lavonna Rua. Only appt this week she has is this Friday. Scheduled for Monday (12/07/21 at 9am). I placed order and she confirmed she received order. Pt will need to go to [facility], go to entrance A. Can use Valet parking (free). They also have WC she can use. Once inside, go to admissions to get signed in. They will bring her to respiratory department.  I called pt. LVM and relayed above information. Provided phone # to respiratory department if she needs to call them for any reason. Also sent mychart message to pt per her request.

## 2021-12-07 ENCOUNTER — Encounter (HOSPITAL_COMMUNITY): Payer: Commercial Managed Care - HMO

## 2021-12-07 NOTE — Telephone Encounter (Signed)
Received a notification from [facility] support program stating that they have triaged the Radicava to in network pharmacy, [facility].

## 2021-12-14 NOTE — Telephone Encounter (Signed)
Jennifer/[facility] is calling and asking is there any assistance needed with filling out a appeal form.

## 2022-01-04 ENCOUNTER — Telehealth: Payer: Self-pay | Admitting: Neurology

## 2022-01-04 NOTE — Telephone Encounter (Signed)
Please call patient, Maria Lynch diagnostic amyotrophic lateral process advanced evaluation genetic testing was negative, 17 genes were tested including SOB  Also check on her referral to [facility] clinic, progress of her disease

## 2022-01-11 ENCOUNTER — Encounter: Payer: Self-pay | Admitting: Internal Medicine

## 2022-01-11 ENCOUNTER — Ambulatory Visit: Payer: Commercial Managed Care - HMO | Attending: Internal Medicine | Admitting: Internal Medicine

## 2022-01-11 VITALS — BP 115/81 | HR 76 | Temp 98.7°F | Ht 63.0 in | Wt 156.4 lb

## 2022-01-11 DIAGNOSIS — Z5181 Encounter for therapeutic drug level monitoring: Secondary | ICD-10-CM

## 2022-01-11 DIAGNOSIS — Z23 Encounter for immunization: Secondary | ICD-10-CM

## 2022-01-11 DIAGNOSIS — F411 Generalized anxiety disorder: Secondary | ICD-10-CM | POA: Insufficient documentation

## 2022-01-11 DIAGNOSIS — G1221 Amyotrophic lateral sclerosis: Secondary | ICD-10-CM | POA: Diagnosis not present

## 2022-01-11 MED ORDER — BUSPIRONE HCL 15 MG PO TABS: 15.0000 mg | ORAL_TABLET | Freq: Two times a day (BID) | ORAL | 4 refills | Status: DC

## 2022-01-11 NOTE — Progress Notes (Signed)
Patient ID: Maria Lynch, female    DOB: [birth year]  MRN: [id number]  CC: Follow-up   Subjective: Maria Lynch is a 41 y.o. female who presents for chronic ds management Her concerns today include:  GERD, PreDM, fibrocystic breast ds, HL, Motor neuron ds.  Since last visit with me, pt was seen by Dr. Terrace Arabia and had abn EMG.  Concern was for Motor Neuron Ds.  Seen by [facility] and dx confirmed.  Started on Riluzole and Relyvrio daily.  The latter suppose to help slow down the progression.  Also on Baclofen before bed. Dr. Epimenio Foot placed her on Valium for cramps and anxiety.  She takes the Valium about once a wk when she really needs to sleep.  Does not want to develop addiction.  Now ambulates with a rollator  seated walker,  Larey Seat a few times but not since she started using the rollator.  Placed on a high calorie diet to help prevent wgh lost associated the disease. Sees a dietician at the ALS clinic.  Down 11 lbs since June.  Feels some of the wgh lost was stress related.  She was really stressed initially about the dx of ALS.  Needs LFT test done for the Riluzole.  She has been on the med 2 mths    Anx: feels she may need to increase anx med Buspar..  She plans to speak with her psychiatrist about this on next visit.  She has a provider who she sees on-line every 2 months.  will be getting married 01/30/2022; stressing a little about that.  Feels her depression is stable on Zoloft.    Patient Active Problem List   Diagnosis Date Noted   ALS (amyotrophic lateral sclerosis) (HCC) 11/19/2021   Atrophy of muscle of multiple sites 11/19/2021   Fasciculations 11/19/2021   Weakness of both arms 11/19/2021   Weakness of both lower limbs 11/19/2021   Gait disturbance 09/11/2021   Weakness of both lower extremities 09/11/2021   Weakness of hand 09/11/2021   Chronic lumbar radiculopathy 03/06/2021   Hip pain, chronic, left 11/04/2020   Writer's cramp 11/04/2020   Overweight (BMI 25.0-29.9)  11/04/2020   Prediabetes 11/04/2020   Gastroesophageal reflux disease without esophagitis 11/04/2020     Current Outpatient Medications on File Prior to Visit  Medication Sig Dispense Refill   baclofen (LIORESAL) 10 MG tablet 1/2 to 1 tablet 3 times a day for spasticity 30 each 0   Cyanocobalamin (B-12 PO) Take 2 capsules by mouth daily.     diazepam (VALIUM) 5 MG tablet Take 1 tablet (5 mg total) by mouth every 12 (twelve) hours as needed for anxiety. (Patient taking differently: Take 5 mg by mouth at bedtime.) 60 tablet 3   famotidine (PEPCID) 20 MG tablet Take 20 mg by mouth daily.     fexofenadine (ALLEGRA) 180 MG tablet Take 180 mg by mouth daily as needed for allergies or rhinitis.     ibuprofen (ADVIL) 200 MG tablet Take 400 mg by mouth every 6 (six) hours as needed for headache or cramping.     Multiple Vitamins-Minerals (ADULT GUMMY) CHEW Chew 2 capsules by mouth daily.     riluzole (RILUTEK) 50 MG tablet Take 1 tablet (50 mg total) by mouth every 12 (twelve) hours. 60 tablet 11   sertraline (ZOLOFT) 50 MG tablet Take 50 mg by mouth daily.     RELYVRIO 3-1 g PACK Take by mouth.     No current facility-administered medications on file  prior to visit.    Allergies  Allergen Reactions   Other Shortness Of Breath and Swelling    Peach fuzz causes throat swelling   Spinach Shortness Of Breath and Swelling    Throat swells     Social History   Socioeconomic History   Marital status: Married    Spouse name: Not on file   Number of children: 0   Years of education: Not on file   Highest education level: Doctorate  Occupational History   Occupation: minister  Tobacco Use   Smoking status: Never   Smokeless tobacco: Never  Vaping Use   Vaping Use: Never used  Substance and Sexual Activity   Alcohol use: Yes    Alcohol/week: 8.0 - 10.0 standard drinks of alcohol    Types: 8 - 10 Cans of beer per week   Drug use: Yes    Comment: edible marijuana   Sexual activity: Not  on file  Other Topics Concern   Not on file  Social History Narrative   Right handed   One story home   Drinks caffeine   Social Determinants of Health   Financial Resource Strain: Not on file  Food Insecurity: Not on file  Transportation Needs: Not on file  Physical Activity: Not on file  Stress: Not on file  Social Connections: Not on file  Intimate Partner Violence: Not on file    Family History  Problem Relation Age of Onset   Ovarian cancer Maternal Grandmother    Diabetes Maternal Grandfather    Heart disease Paternal Grandmother     Past Surgical History:  Procedure Laterality Date   WISDOM TOOTH EXTRACTION      ROS: Review of Systems Negative except as stated above  PHYSICAL EXAM: BP 115/81   Pulse 76   Temp 98.7 F (37.1 C) (Oral)   Ht 5\' 3"  (1.6 m)   Wt 156 lb 6.4 oz (70.9 kg)   LMP 01/10/2022   SpO2 95%   BMI 27.71 kg/m   Wt Readings from Last 3 Encounters:  01/11/22 156 lb 6.4 oz (70.9 kg)  11/16/21 161 lb 3.2 oz (73.1 kg)  11/13/20 167 lb (75.8 kg)    Physical Exam  General appearance - alert, well appearing, middle age caucasian female and in no distress Mental status - normal mood, behavior, speech, dress, motor activity, and thought processes Neck - supple, no significant adenopathy Chest - clear to auscultation, no wheezes, rales or rhonchi, symmetric air entry Heart - normal rate, regular rhythm, normal S1, S2, no murmurs, rubs, clicks or gallops Extremities - no LE edema MSK: Patient has rollator seated walker with her.    09/11/2021    9:30 AM 03/06/2021    9:51 AM 11/04/2020    9:05 AM  GAD 7 : Generalized Anxiety Score  Nervous, Anxious, on Edge 1 1 1   Control/stop worrying 0 0 0  Worry too much - different things 0 0 0  Trouble relaxing 0 0 0  Restless 0 0 0  Easily annoyed or irritable 0 0 0  Afraid - awful might happen 1 0 0  Total GAD 7 Score 2 1 1        09/11/2021    9:30 AM 03/06/2021    9:51 AM 11/04/2020     9:05 AM  Depression screen PHQ 2/9  Decreased Interest 0 0 0  Down, Depressed, Hopeless 1 0 0  PHQ - 2 Score 1 0 0  Altered sleeping 0  Tired, decreased energy 0    Change in appetite 0    Feeling bad or failure about yourself  0    Trouble concentrating 0    Moving slowly or fidgety/restless 0    Suicidal thoughts 0    PHQ-9 Score 1         Latest Ref Rng & Units 11/04/2020    9:57 AM  CMP  Glucose 65 - 99 mg/dL 89   BUN 6 - 24 mg/dL 12   Creatinine 5.78 - 1.00 mg/dL 4.69   Sodium 629 - 528 mmol/L 138   Potassium 3.5 - 5.2 mmol/L 4.7   Chloride 96 - 106 mmol/L 101   CO2 20 - 29 mmol/L 20   Calcium 8.7 - 10.2 mg/dL 9.9   Total Protein 6.0 - 8.5 g/dL 7.2   Total Bilirubin 0.0 - 1.2 mg/dL 0.3   Alkaline Phos 44 - 121 IU/L 63   AST 0 - 40 IU/L 16   ALT 0 - 32 IU/L 20    Lipid Panel     Component Value Date/Time   CHOL 186 11/04/2020 0957   TRIG 121 11/04/2020 0957   HDL 54 11/04/2020 0957   CHOLHDL 3.4 11/04/2020 0957   LDLCALC 110 (H) 11/04/2020 0957    CBC    Component Value Date/Time   WBC 9.0 11/04/2020 0957   RBC 4.53 11/04/2020 0957   HGB 13.9 11/04/2020 0957   HCT 41.4 11/04/2020 0957   PLT 324 11/04/2020 0957   MCV 91 11/04/2020 0957   MCH 30.7 11/04/2020 0957   MCHC 33.6 11/04/2020 0957   RDW 12.5 11/04/2020 0957    ASSESSMENT AND PLAN: 1. ALS (amyotrophic lateral sclerosis) (HCC) New unconfirmed diagnosis.  She has been plugged in with a ALS clinic through [facility]. She needs her LFTs done as part of monitoring for Riluzole - Hepatic Function Panel  2. GAD (generalized anxiety disorder) Not as well-controlled as she would like it to be.  We discussed increasing the BuSpar from 10 mg twice a day to 15 mg twice a day until she sees her psychiatrist again. - busPIRone (BUSPAR) 15 MG tablet; Take 1 tablet (15 mg total) by mouth 2 (two) times daily.  Dispense: 60 tablet; Refill: 4  3. Medication monitoring encounter - Hepatic Function Panel  4.  Need for immunization against influenza - Flu Vaccine QUAD 65mo+IM (Fluarix, Fluzone & Alfiuria Quad PF)    Patient was given the opportunity to ask questions.  Patient verbalized understanding of the plan and was able to repeat key elements of the plan.   This documentation was completed using Paediatric nurse.  Any transcriptional errors are unintentional.  Orders Placed This Encounter  Procedures   Flu Vaccine QUAD 53mo+IM (Fluarix, Fluzone & Alfiuria Quad PF)   Hepatic Function Panel     Requested Prescriptions   Signed Prescriptions Disp Refills   busPIRone (BUSPAR) 15 MG tablet 60 tablet 4    Sig: Take 1 tablet (15 mg total) by mouth 2 (two) times daily.    Return in about 6 months (around 07/14/2022).  Jonah Blue, MD, FACP

## 2022-01-11 NOTE — Patient Instructions (Signed)
We have increased the BuSpar to 15 mg twice a day until you see a psychiatrist.

## 2022-01-12 LAB — HEPATIC FUNCTION PANEL
ALT: 20 IU/L (ref 0–32)
AST: 23 IU/L (ref 0–40)
Albumin: 4.4 g/dL (ref 3.9–4.9)
Alkaline Phosphatase: 58 IU/L (ref 44–121)
Bilirubin Total: 0.3 mg/dL (ref 0.0–1.2)
Bilirubin, Direct: 0.11 mg/dL (ref 0.00–0.40)
Total Protein: 6.8 g/dL (ref 6.0–8.5)

## 2022-01-19 ENCOUNTER — Encounter: Payer: Self-pay | Admitting: Internal Medicine

## 2022-01-20 ENCOUNTER — Other Ambulatory Visit: Payer: Self-pay | Admitting: Internal Medicine

## 2022-01-20 MED ORDER — SULFAMETHOXAZOLE-TRIMETHOPRIM 800-160 MG PO TABS: 1.0000 | ORAL_TABLET | Freq: Two times a day (BID) | ORAL | 0 refills | Status: DC

## 2022-02-24 ENCOUNTER — Telehealth: Payer: Self-pay | Admitting: Internal Medicine

## 2022-02-24 NOTE — Telephone Encounter (Signed)
Error

## 2022-03-08 ENCOUNTER — Other Ambulatory Visit: Payer: Self-pay | Admitting: Internal Medicine

## 2022-03-08 MED ORDER — SERTRALINE HCL 50 MG PO TABS
50.0000 mg | ORAL_TABLET | Freq: Every day | ORAL | 2 refills | Status: DC
  Filled 2022-03-08: qty 30, 30d supply, fill #0
  Filled 2022-04-06: qty 30, 30d supply, fill #1

## 2022-03-09 ENCOUNTER — Other Ambulatory Visit (HOSPITAL_COMMUNITY): Payer: Self-pay

## 2022-04-06 ENCOUNTER — Other Ambulatory Visit (HOSPITAL_COMMUNITY): Payer: Self-pay

## 2022-04-12 ENCOUNTER — Other Ambulatory Visit (HOSPITAL_COMMUNITY): Payer: Self-pay

## 2022-05-15 ENCOUNTER — Other Ambulatory Visit: Payer: Self-pay | Admitting: Internal Medicine

## 2022-05-16 NOTE — Telephone Encounter (Signed)
Last RF 03/08/22 #30 2 RF  Requested Prescriptions  Refused Prescriptions Disp Refills   sertraline (ZOLOFT) 50 MG tablet [Pharmacy Med Name: SERTRALINE HCL 50 MG TABLET] 30 tablet 2    Sig: TAKE 1 TABLET BY MOUTH EVERY DAY     Psychiatry:  Antidepressants - SSRI - sertraline Passed - 05/15/2022  8:47 AM      Passed - AST in normal range and within 360 days    AST  Date Value Ref Range Status  01/11/2022 23 0 - 40 IU/L Final         Passed - ALT in normal range and within 360 days    ALT  Date Value Ref Range Status  01/11/2022 20 0 - 32 IU/L Final         Passed - Completed PHQ-2 or PHQ-9 in the last 360 days      Passed - Valid encounter within last 6 months    Recent Outpatient Visits           4 months ago ALS (amyotrophic lateral sclerosis) (HCC)   [facility] Marcine Matar, MD   8 months ago Gait disturbance   [facility] Marcine Matar, MD   1 year ago Chronic lumbar radiculopathy   [facility] Marcine Matar, MD   1 year ago Encounter to establish care   [facility] Marcine Matar, MD       Future Appointments             In 2 months Laural Benes Binnie Rail, MD [facility]

## 2022-06-04 ENCOUNTER — Other Ambulatory Visit: Payer: Self-pay | Admitting: Internal Medicine

## 2022-07-06 ENCOUNTER — Other Ambulatory Visit: Payer: Self-pay | Admitting: Internal Medicine

## 2022-07-15 ENCOUNTER — Ambulatory Visit: Payer: Commercial Managed Care - HMO | Admitting: Internal Medicine

## 2022-07-20 ENCOUNTER — Other Ambulatory Visit: Payer: Self-pay | Admitting: Internal Medicine

## 2022-07-20 DIAGNOSIS — F411 Generalized anxiety disorder: Secondary | ICD-10-CM

## 2022-07-20 NOTE — Telephone Encounter (Signed)
Requested Prescriptions  Pending Prescriptions Disp Refills   busPIRone (BUSPAR) 15 MG tablet [Pharmacy Med Name: BUSPIRONE HCL 15 MG TABLET] 180 tablet 1    Sig: TAKE 1 TABLET BY MOUTH 2 TIMES DAILY.     Psychiatry: Anxiolytics/Hypnotics - Non-controlled Passed - 07/20/2022  1:35 AM      Passed - Valid encounter within last 12 months    Recent Outpatient Visits           6 months ago ALS (amyotrophic lateral sclerosis) ([place])   [place] Ladell Pier, MD   10 months ago Gait disturbance   [place], MD   1 year ago Chronic lumbar radiculopathy   [place] Ladell Pier, MD   1 year ago Encounter to establish care   [place], MD       Future Appointments             In 2 months Ladell Pier, MD [place]

## 2022-08-18 ENCOUNTER — Encounter: Payer: Self-pay | Admitting: Internal Medicine

## 2022-08-20 ENCOUNTER — Other Ambulatory Visit: Payer: Self-pay | Admitting: Internal Medicine

## 2022-08-20 DIAGNOSIS — F411 Generalized anxiety disorder: Secondary | ICD-10-CM

## 2022-08-20 MED ORDER — BUSPIRONE HCL 15 MG PO TABS: 15.0000 mg | ORAL_TABLET | Freq: Two times a day (BID) | ORAL | 1 refills | Status: DC

## 2022-08-20 MED ORDER — SERTRALINE HCL 50 MG PO TABS: 50.0000 mg | ORAL_TABLET | Freq: Every day | ORAL | 0 refills | Status: DC

## 2022-09-30 ENCOUNTER — Ambulatory Visit: Payer: Commercial Managed Care - HMO | Attending: Internal Medicine | Admitting: Internal Medicine

## 2022-09-30 ENCOUNTER — Encounter: Payer: Self-pay | Admitting: Internal Medicine

## 2022-09-30 VITALS — BP 118/76 | HR 69 | Temp 97.9°F | Ht 63.0 in

## 2022-09-30 DIAGNOSIS — M25511 Pain in right shoulder: Secondary | ICD-10-CM

## 2022-09-30 DIAGNOSIS — G8929 Other chronic pain: Secondary | ICD-10-CM | POA: Diagnosis not present

## 2022-09-30 DIAGNOSIS — G1221 Amyotrophic lateral sclerosis: Secondary | ICD-10-CM | POA: Diagnosis not present

## 2022-09-30 DIAGNOSIS — F411 Generalized anxiety disorder: Secondary | ICD-10-CM | POA: Diagnosis not present

## 2022-09-30 NOTE — Patient Instructions (Signed)
Recommend taking ibuprofen 600 mg twice a day for the next 10 days for the right shoulder.  Take the ibuprofen with food.  If no improvement, please let me know so that we can refer you to an orthopedic or sports medicine specialist.

## 2022-09-30 NOTE — Progress Notes (Signed)
Patient ID: Maria Lynch, female    DOB: [birth year]  MRN: [id number]  CC: Follow-up (ALS f/u. Reports taking gabapentin but unsure of dosage/Continuous pain on R shoulder - would like something over that lidocaine patches/Suspects fluid retention, swelling of feet x6 mo)   Subjective: Maria Lynch is a 42 y.o. female who presents for chronic ds management Her concerns today include:  GERD, GAD, PreDM, fibrocystic breast ds, HL, Motor neuron ds.   ALS: Still followed at ALS clinic at [facility].  She has an upcoming appointment next week.  She is having more difficulty with ambulation and speech.  Uses her walker most of the time but uses the wheelchair if she has to go father distances.  Relvrio was d/c.  Still on Riluzole which she finds helpful. She was married last September. She is no longer seeing the behavioral health physician online.  Feels stable on Zoloft and BuSpar in terms of her anxiety. She is eating and sleeping okay.  Uses Valium only occasionally.  C/o pain in RT shoulder for about 3 mths, low dull but sharp at times; worse with transfers.   difficulty lifting the arm.  Since her diagnosis of ALS, she has had greater weakness in the right upper extremity and left lower extremity. Using Lidocaine cream on the shoulder but does not seem to be helping much. The pain is over the deltoid region and not in the joint itself.  Patient Active Problem List   Diagnosis Date Noted   GAD (generalized anxiety disorder) 01/11/2022   ALS (amyotrophic lateral sclerosis) (HCC) 11/19/2021   Atrophy of muscle of multiple sites 11/19/2021   Fasciculations 11/19/2021   Weakness of both arms 11/19/2021   Weakness of both lower limbs 11/19/2021   Gait disturbance 09/11/2021   Weakness of both lower extremities 09/11/2021   Weakness of hand 09/11/2021   Chronic lumbar radiculopathy 03/06/2021   Hip pain, chronic, left 11/04/2020   Writer's cramp 11/04/2020   Overweight (BMI 25.0-29.9)  11/04/2020   Prediabetes 11/04/2020   Gastroesophageal reflux disease without esophagitis 11/04/2020     Current Outpatient Medications on File Prior to Visit  Medication Sig Dispense Refill   gabapentin (NEURONTIN) 100 MG capsule Take by mouth.     baclofen (LIORESAL) 10 MG tablet 1/2 to 1 tablet 3 times a day for spasticity 30 each 0   busPIRone (BUSPAR) 15 MG tablet Take 1 tablet (15 mg total) by mouth 2 (two) times daily. 180 tablet 1   Cyanocobalamin (B-12 PO) Take 2 capsules by mouth daily.     diazepam (VALIUM) 5 MG tablet Take 1 tablet (5 mg total) by mouth every 12 (twelve) hours as needed for anxiety. (Patient taking differently: Take 5 mg by mouth at bedtime.) 60 tablet 3   famotidine (PEPCID) 20 MG tablet Take 20 mg by mouth daily.     fexofenadine (ALLEGRA) 180 MG tablet Take 180 mg by mouth daily as needed for allergies or rhinitis.     ibuprofen (ADVIL) 200 MG tablet Take 400 mg by mouth every 6 (six) hours as needed for headache or cramping.     Multiple Vitamins-Minerals (ADULT GUMMY) CHEW Chew 2 capsules by mouth daily.     riluzole (RILUTEK) 50 MG tablet Take 1 tablet (50 mg total) by mouth every 12 (twelve) hours. 60 tablet 11   sertraline (ZOLOFT) 50 MG tablet Take 1 tablet (50 mg total) by mouth daily. 90 tablet 0   No current facility-administered medications on file prior  to visit.    Allergies  Allergen Reactions   Other Shortness Of Breath and Swelling    Peach fuzz causes throat swelling   Spinach Shortness Of Breath and Swelling    Throat swells     Social History   Socioeconomic History   Marital status: Married    Spouse name: Not on file   Number of children: 0   Years of education: Not on file   Highest education level: Professional school degree (e.g., MD, DDS, DVM, JD)  Occupational History   Occupation: minister  Tobacco Use   Smoking status: Never   Smokeless tobacco: Never  Vaping Use   Vaping Use: Never used  Substance and Sexual  Activity   Alcohol use: Yes    Alcohol/week: 8.0 - 10.0 standard drinks of alcohol    Types: 8 - 10 Cans of beer per week   Drug use: Yes    Comment: edible marijuana   Sexual activity: Not on file  Other Topics Concern   Not on file  Social History Narrative   Right handed   One story home   Drinks caffeine   Social Determinants of Health   Financial Resource Strain: Low Risk  (09/27/2022)   Overall Financial Resource Strain (CARDIA)    Difficulty of Paying Living Expenses: Not hard at all  Food Insecurity: No Food Insecurity (09/27/2022)   Hunger Vital Sign    Worried About Running Out of Food in the Last Year: Never true    Ran Out of Food in the Last Year: Never true  Transportation Needs: No Transportation Needs (09/27/2022)   PRAPARE - Administrator, Civil Service (Medical): No    Lack of Transportation (Non-Medical): No  Physical Activity: Unknown (09/27/2022)   Exercise Vital Sign    Days of Exercise per Week: 0 days    Minutes of Exercise per Session: Not on file  Stress: Stress Concern Present (09/27/2022)   [employer] of Occupational Health - Occupational Stress Questionnaire    Feeling of Stress : To some extent  Social Connections: Socially Integrated (09/27/2022)   Social Connection and Isolation Panel [NHANES]    Frequency of Communication with Friends and Family: More than three times a week    Frequency of Social Gatherings with Friends and Family: More than three times a week    Attends Religious Services: More than 4 times per year    Active Member of [employer] or Organizations: Yes    Attends Engineer, structural: More than 4 times per year    Marital Status: Married  Catering manager Violence: Not on file    Family History  Problem Relation Age of Onset   Ovarian cancer Maternal Grandmother    Diabetes Maternal Grandfather    Heart disease Paternal Grandmother     Past Surgical History:  Procedure Laterality Date   WISDOM  TOOTH EXTRACTION      ROS: Review of Systems Negative except as stated above  PHYSICAL EXAM: BP 118/76   Pulse 69   Temp 97.9 F (36.6 C) (Oral)   Ht 5\' 3"  (1.6 m)   SpO2 95%   BMI 27.71 kg/m   Wt Readings from Last 3 Encounters:  01/11/22 156 lb 6.4 oz (70.9 kg)  11/16/21 161 lb 3.2 oz (73.1 kg)  11/13/20 167 lb (75.8 kg)    Physical Exam  General appearance -middle-age Caucasian female sitting in wheelchair in NAD Mental status - normal mood, behavior, speech, dress, motor  activity, and thought processes Chest - clear to auscultation, no wheezes, rales or rhonchi, symmetric air entry Heart - normal rate, regular rhythm, normal S1, S2, no murmurs, rubs, clicks or gallops Musculoskeletal -slight tenderness over the right deltoid area.  No swelling or erythema noted.  Mild to moderate discomfort with attempted passive range of motion of the shoulder.  Unable to elevate the right arm more than 30 degrees without assistance. Grip significantly decreased on the right.  Power in the upper extremities 4/5 on the left 3/5 on the right.  Noted to have some tremors/fasciculations of the right upper extremity with resisted motion.       Latest Ref Rng & Units 01/11/2022   10:30 AM 11/04/2020    9:57 AM  CMP  Glucose 65 - 99 mg/dL  89   BUN 6 - 24 mg/dL  12   Creatinine 8.11 - 1.00 mg/dL  9.14   Sodium 782 - 956 mmol/L  138   Potassium 3.5 - 5.2 mmol/L  4.7   Chloride 96 - 106 mmol/L  101   CO2 20 - 29 mmol/L  20   Calcium 8.7 - 10.2 mg/dL  9.9   Total Protein 6.0 - 8.5 g/dL 6.8  7.2   Total Bilirubin 0.0 - 1.2 mg/dL 0.3  0.3   Alkaline Phos 44 - 121 IU/L 58  63   AST 0 - 40 IU/L 23  16   ALT 0 - 32 IU/L 20  20    Lipid Panel     Component Value Date/Time   CHOL 186 11/04/2020 0957   TRIG 121 11/04/2020 0957   HDL 54 11/04/2020 0957   CHOLHDL 3.4 11/04/2020 0957   LDLCALC 110 (H) 11/04/2020 0957    CBC    Component Value Date/Time   WBC 9.0 11/04/2020 0957   RBC  4.53 11/04/2020 0957   HGB 13.9 11/04/2020 0957   HCT 41.4 11/04/2020 0957   PLT 324 11/04/2020 0957   MCV 91 11/04/2020 0957   MCH 30.7 11/04/2020 0957   MCHC 33.6 11/04/2020 0957   RDW 12.5 11/04/2020 0957    ASSESSMENT AND PLAN: 1. Chronic right shoulder pain -Of questionable etiology.  Seems to be more in the deltoid muscle than in the joint.  Worsened with underlying weakness associated with ALS.  I recommend using ibuprofen over-the-counter.  Advised to take 200 mg 3 tablets twice a day for the next 10 days.  If no improvement, she will let me know so that we could submit a referral to orthopedics or sports medicine.  Advised to take the ibuprofen with food.  2. GAD (generalized anxiety disorder) Stable on Zoloft and BuSpar.  3. ALS (amyotrophic lateral sclerosis) (HCC) Followed at [facility].    Patient was given the opportunity to ask questions.  Patient verbalized understanding of the plan and was able to repeat key elements of the plan.   This documentation was completed using Paediatric nurse.  Any transcriptional errors are unintentional.  No orders of the defined types were placed in this encounter.    Requested Prescriptions    No prescriptions requested or ordered in this encounter    Return in about 6 months (around 04/02/2023).  Jonah Blue, MD, FACP

## 2022-10-19 ENCOUNTER — Encounter: Payer: Self-pay | Admitting: Internal Medicine

## 2022-10-20 ENCOUNTER — Other Ambulatory Visit: Payer: Self-pay | Admitting: Internal Medicine

## 2022-10-20 DIAGNOSIS — G8929 Other chronic pain: Secondary | ICD-10-CM

## 2022-10-26 ENCOUNTER — Telehealth: Payer: Self-pay | Admitting: Physician Assistant

## 2022-10-26 NOTE — Telephone Encounter (Signed)
Sent message to patient through mychart advising that she needed to call and reschedule her appointment as well as calling and leaving a voicemail messages.

## 2022-10-29 ENCOUNTER — Ambulatory Visit: Payer: Commercial Managed Care - HMO | Admitting: Physician Assistant

## 2022-11-02 ENCOUNTER — Ambulatory Visit (INDEPENDENT_AMBULATORY_CARE_PROVIDER_SITE_OTHER): Payer: Commercial Managed Care - HMO | Admitting: Orthopaedic Surgery

## 2022-11-02 ENCOUNTER — Other Ambulatory Visit (INDEPENDENT_AMBULATORY_CARE_PROVIDER_SITE_OTHER): Payer: Commercial Managed Care - HMO

## 2022-11-02 DIAGNOSIS — G8929 Other chronic pain: Secondary | ICD-10-CM | POA: Diagnosis not present

## 2022-11-02 DIAGNOSIS — M25511 Pain in right shoulder: Secondary | ICD-10-CM | POA: Diagnosis not present

## 2022-11-02 NOTE — Progress Notes (Signed)
Office Visit Note   Patient: Maria Lynch           Date of Birth: 02-12-1981           MRN: [id number] Visit Date: 11/02/2022              Requested by: Marcine Matar, MD [address] [place] [address] [place],  [place] [address] PCP: Marcine Matar, MD   Assessment & Plan: Visit Diagnoses:  1. Chronic right shoulder pain     Plan: Impression is 42 year old female with ALS with adhesive capsulitis of the right shoulder.  Disease process explained.  We will make a referral to Dr. Shon Baton for intra-articular steroid injection and we will also order home health PT.  We will have her follow-up with Dr. Shon Baton for follow-up of the frozen shoulder.  Follow-Up Instructions: No follow-ups on file.   Orders:  Orders Placed This Encounter  Procedures   XR Shoulder Right   No orders of the defined types were placed in this encounter.     Procedures: No procedures performed   Clinical Data: No additional findings.   Subjective: Chief Complaint  Patient presents with   Right Shoulder - Pain    HPI Maria Lynch is a very pleasant 42 year old female here for evaluation of 3 months of right shoulder pain.  Denies any injuries.  Patient has been diagnosed with ALS within the last year.  She is right-hand dominant.  She has noticed increased weakness and decreased range of motion of the shoulder.  Occasional numbness and tingling and neck pain. Review of Systems  Constitutional: Negative.   HENT: Negative.    Eyes: Negative.   Respiratory: Negative.    Cardiovascular: Negative.   Endocrine: Negative.   Musculoskeletal: Negative.   Neurological: Negative.   Hematological: Negative.   Psychiatric/Behavioral: Negative.    All other systems reviewed and are negative.    Objective: Vital Signs: There were no vitals taken for this visit.  Physical Exam Vitals and nursing note reviewed.  Constitutional:      Appearance: She is well-developed.  HENT:     Head: Atraumatic.      Nose: Nose normal.  Eyes:     Extraocular Movements: Extraocular movements intact.  Cardiovascular:     Pulses: Normal pulses.  Pulmonary:     Effort: Pulmonary effort is normal.  Abdominal:     Palpations: Abdomen is soft.  Musculoskeletal:     Cervical back: Neck supple.  Skin:    General: Skin is warm.     Capillary Refill: Capillary refill takes less than 2 seconds.  Neurological:     Mental Status: She is alert. Mental status is at baseline.  Psychiatric:        Behavior: Behavior normal.        Thought Content: Thought content normal.        Judgment: Judgment normal.     Ortho Exam Examination of the right shoulder shows muscular weakness due to ALS.  Her passive range of motion is moderately limited with pain. Specialty Comments:  No specialty comments available.  Imaging: XR Shoulder Right  Result Date: 11/02/2022 3 view xrays show no acute or structural abnormalities    PMFS History: Patient Active Problem List   Diagnosis Date Noted   GAD (generalized anxiety disorder) 01/11/2022   ALS (amyotrophic lateral sclerosis) (HCC) 11/19/2021   Atrophy of muscle of multiple sites 11/19/2021   Fasciculations 11/19/2021   Weakness of both arms 11/19/2021  Weakness of both lower limbs 11/19/2021   Gait disturbance 09/11/2021   Weakness of both lower extremities 09/11/2021   Weakness of hand 09/11/2021   Chronic lumbar radiculopathy 03/06/2021   Hip pain, chronic, left 11/04/2020   Writer's cramp 11/04/2020   Overweight (BMI 25.0-29.9) 11/04/2020   Prediabetes 11/04/2020   Gastroesophageal reflux disease without esophagitis 11/04/2020   Past Medical History:  Diagnosis Date   GERD (gastroesophageal reflux disease)    Prediabetes     Family History  Problem Relation Age of Onset   Ovarian cancer Maternal Grandmother    Diabetes Maternal Grandfather    Heart disease Paternal Grandmother     Past Surgical History:  Procedure Laterality Date   WISDOM  TOOTH EXTRACTION     Social History   Occupational History   Occupation: Optician, dispensing  Tobacco Use   Smoking status: Never   Smokeless tobacco: Never  Vaping Use   Vaping Use: Never used  Substance and Sexual Activity   Alcohol use: Yes    Alcohol/week: 8.0 - 10.0 standard drinks of alcohol    Types: 8 - 10 Cans of beer per week   Drug use: Yes    Comment: edible marijuana   Sexual activity: Not on file

## 2022-11-12 ENCOUNTER — Other Ambulatory Visit: Payer: Self-pay | Admitting: Internal Medicine

## 2022-11-26 ENCOUNTER — Other Ambulatory Visit: Payer: Self-pay

## 2022-11-26 ENCOUNTER — Ambulatory Visit (INDEPENDENT_AMBULATORY_CARE_PROVIDER_SITE_OTHER): Payer: Commercial Managed Care - HMO | Admitting: Sports Medicine

## 2022-11-26 ENCOUNTER — Encounter: Payer: Self-pay | Admitting: Sports Medicine

## 2022-11-26 DIAGNOSIS — G8929 Other chronic pain: Secondary | ICD-10-CM | POA: Diagnosis not present

## 2022-11-26 DIAGNOSIS — M25511 Pain in right shoulder: Secondary | ICD-10-CM | POA: Diagnosis not present

## 2022-11-26 DIAGNOSIS — M7501 Adhesive capsulitis of right shoulder: Secondary | ICD-10-CM | POA: Diagnosis not present

## 2022-11-26 MED ORDER — BUPIVACAINE HCL 0.25 % IJ SOLN
2.0000 mL | INTRAMUSCULAR | Status: AC | PRN
Start: 2022-11-26 — End: 2022-11-26
  Administered 2022-11-26: 2 mL via INTRA_ARTICULAR

## 2022-11-26 MED ORDER — LIDOCAINE HCL 1 % IJ SOLN
2.0000 mL | INTRAMUSCULAR | Status: AC | PRN
Start: 2022-11-26 — End: 2022-11-26
  Administered 2022-11-26: 2 mL

## 2022-11-26 MED ORDER — METHYLPREDNISOLONE ACETATE 80 MG/ML IJ SUSP
80.0000 mg | INTRAMUSCULAR | Status: AC | PRN
Start: 2022-11-26 — End: 2022-11-26
  Administered 2022-11-26: 80 mg via INTRA_ARTICULAR

## 2022-11-26 NOTE — Progress Notes (Signed)
Patient was instructed in 10 minutes of therapeutic exercises for right shoulder to improve strength, ROM and function according to my instructions and plan of care by a Certified Athletic Trainer during the office visit. A customized handout was provided and demonstration of proper technique shown and discussed. Patient did perform exercises and demonstrate understanding through teachback.  All questions discussed and answered.  

## 2022-11-26 NOTE — Progress Notes (Signed)
   Procedure Note  Patient: Maria Lynch             Date of Birth: 1980/10/25           MRN: [id number]             Visit Date: 11/26/2022  Procedures: Visit Diagnoses:  1. Chronic right shoulder pain   2. Adhesive capsulitis of right shoulder    Large Joint Inj: R glenohumeral on 11/26/2022 1:09 PM Indications: pain Details: 22 G 3.5 in needle, ultrasound-guided posterior approach Medications: 2 mL lidocaine 1 %; 2 mL bupivacaine 0.25 %; 80 mg methylPREDNISolone acetate 80 MG/ML Outcome: tolerated well, no immediate complications  US-guided glenohumeral joint injection, right shoulder After discussion on risks/benefits/indications, informed verbal consent was obtained. A timeout was then performed. The patient was positioned lying lateral recumbent on examination table. The patient's shoulder was prepped with betadine and multiple alcohol swabs and utilizing ultrasound guidance, the patient's glenohumeral joint was identified on ultrasound. Using ultrasound guidance a 22-gauge, 3.5 inch needle with a mixture of 3:3:1 cc's lidocaine:bupivicaine:depomedrol was directed from a lateral to medial direction via in-plane technique into the glenohumeral joint with visualization of appropriate spread of injectate into the joint. Patient tolerated the procedure well without immediate complications.      Procedure, treatment alternatives, risks and benefits explained, specific risks discussed. Consent was given by the patient. Immediately prior to procedure a time out was called to verify the correct patient, procedure, equipment, support staff and site/side marked as required. Patient was prepped and draped in the usual sterile fashion.     - I evaluated the patient about 10 minutes post-injection and she had some improvement in pain and range of motion - we will see her back for re-evaluation and see improvement in ROM, pain in 2 weeks. May consider 2nd injection if not > 80% improved - it is  difficult for her to get to formalized PT --> did discuss the role for home exercise therapy, she would like to pursue this. We did print out a customized handout for shoulder/adhesive capsulitis exercises, my athletic trainer Lequita Halt did review these in the room with her today.  She is to perform these twice daily.  Maria Brunner, DO Primary Care Sports Medicine Physician  [facility] - Orthoped[facility]  This note was dictated using Dragon naturally speaking software and may contain errors in syntax, spelling, or content which have not been identified prior to signing this note.

## 2022-12-20 ENCOUNTER — Ambulatory Visit: Payer: Commercial Managed Care - HMO | Admitting: Sports Medicine

## 2022-12-21 ENCOUNTER — Other Ambulatory Visit: Payer: Self-pay

## 2022-12-21 ENCOUNTER — Ambulatory Visit (INDEPENDENT_AMBULATORY_CARE_PROVIDER_SITE_OTHER): Payer: Managed Care, Other (non HMO) | Admitting: Sports Medicine

## 2022-12-21 DIAGNOSIS — M7501 Adhesive capsulitis of right shoulder: Secondary | ICD-10-CM

## 2022-12-21 DIAGNOSIS — M25511 Pain in right shoulder: Secondary | ICD-10-CM | POA: Diagnosis not present

## 2022-12-21 DIAGNOSIS — G8929 Other chronic pain: Secondary | ICD-10-CM

## 2022-12-21 NOTE — Progress Notes (Unsigned)
Maria Lynch - 42 y.o. female MRN [id number]  Date of birth: February 05, 1981  Office Visit Note: Visit Date: 12/21/2022 PCP: Marcine Matar, MD Referred by: Marcine Matar, MD  Subjective: No chief complaint on file.  HPI: Maria Lynch is a pleasant 42 y.o. female who presents today for follow-up right shoulder adhesive capsulitis.    Pertinent ROS were reviewed with the patient and found to be negative unless otherwise specified above in HPI.   Assessment & Plan: Visit Diagnoses: No diagnosis found.  Plan: ***  Follow-up: No follow-ups on file.   Meds & Orders: No orders of the defined types were placed in this encounter.  No orders of the defined types were placed in this encounter.    Procedures: Large Joint Inj: R glenohumeral on 12/21/2022 5:27 PM Indications: pain Details: 22 G 3.5 in needle, ultrasound-guided posterior approach Medications: 3 mL lidocaine 1 %; 3 mL bupivacaine 0.25 %; 40 mg methylPREDNISolone acetate 40 MG/ML (11 mL of D5W) Outcome: tolerated well, no immediate complications  US-guided glenohumeral joint injection, right shoulder After discussion on risks/benefits/indications, informed verbal consent was obtained. A timeout was then performed. The patient was positioned lying lateral recumbent on examination table. The patient's shoulder was prepped with betadine and multiple alcohol swabs and utilizing ultrasound guidance, the patient's glenohumeral joint was identified on ultrasound. Using ultrasound guidance a 22-gauge, 3.5 inch needle with a mixture of 3:3:1:11 cc's lidocaine:bupivicaine:depomedrol:D5W (total of 18cc of injectate for high-volume) was directed from a lateral to medial direction via in-plane technique into the glenohumeral joint with visualization of appropriate spread of injectate into the joint. Patient tolerated the procedure well without immediate complications.      Procedure, treatment alternatives, risks and benefits  explained, specific risks discussed. Consent was given by the patient. Immediately prior to procedure a time out was called to verify the correct patient, procedure, equipment, support staff and site/side marked as required. Patient was prepped and draped in the usual sterile fashion.          Clinical History: No specialty comments available.  She reports that she has never smoked. She has never used smokeless tobacco. No results for input(s): "HGBA1C", "LABURIC" in the last 8760 hours.  Objective:   Vital Signs: There were no vitals taken for this visit.  Physical Exam  Gen: Well-appearing, in no acute distress; non-toxic CV: Regular Rate. Well-perfused. Warm.  Resp: Breathing unlabored on room air; no wheezing. Psych: Fluid speech in conversation; appropriate affect; normal thought process Neuro: Sensation intact throughout. No gross coordination deficits.   Ortho Exam - ***  Imaging: No results found.  Past Medical/Family/Surgical/Social History: Medications & Allergies reviewed per EMR, new medications updated. Patient Active Problem List   Diagnosis Date Noted   GAD (generalized anxiety disorder) 01/11/2022   ALS (amyotrophic lateral sclerosis) (HCC) 11/19/2021   Atrophy of muscle of multiple sites 11/19/2021   Fasciculations 11/19/2021   Weakness of both arms 11/19/2021   Weakness of both lower limbs 11/19/2021   Gait disturbance 09/11/2021   Weakness of both lower extremities 09/11/2021   Weakness of hand 09/11/2021   Chronic lumbar radiculopathy 03/06/2021   Hip pain, chronic, left 11/04/2020   Writer's cramp 11/04/2020   Overweight (BMI 25.0-29.9) 11/04/2020   Prediabetes 11/04/2020   Gastroesophageal reflux disease without esophagitis 11/04/2020   Past Medical History:  Diagnosis Date   GERD (gastroesophageal reflux disease)    Prediabetes    Family History  Problem Relation Age of Onset  Ovarian cancer Maternal Grandmother    Diabetes Maternal  Grandfather    Heart disease Paternal Grandmother    Past Surgical History:  Procedure Laterality Date   WISDOM TOOTH EXTRACTION     Social History   Occupational History   Occupation: minister  Tobacco Use   Smoking status: Never   Smokeless tobacco: Never  Vaping Use   Vaping status: Never Used  Substance and Sexual Activity   Alcohol use: Yes    Alcohol/week: 8.0 - 10.0 standard drinks of alcohol    Types: 8 - 10 Cans of beer per week   Drug use: Yes    Comment: edible marijuana   Sexual activity: Not on file

## 2022-12-21 NOTE — Progress Notes (Signed)
Doing better; but would like to go forward with 2nd injection today

## 2022-12-22 MED ORDER — METHYLPREDNISOLONE ACETATE 40 MG/ML IJ SUSP
40.0000 mg | INTRAMUSCULAR | Status: AC | PRN
Start: 2022-12-21 — End: 2022-12-21
  Administered 2022-12-21: 40 mg via INTRA_ARTICULAR

## 2022-12-22 MED ORDER — LIDOCAINE HCL 1 % IJ SOLN
3.0000 mL | INTRAMUSCULAR | Status: AC | PRN
Start: 2022-12-21 — End: 2022-12-21
  Administered 2022-12-21: 3 mL

## 2022-12-22 MED ORDER — BUPIVACAINE HCL 0.25 % IJ SOLN
3.0000 mL | INTRAMUSCULAR | Status: AC | PRN
Start: 2022-12-21 — End: 2022-12-21
  Administered 2022-12-21: 3 mL via INTRA_ARTICULAR

## 2023-01-19 ENCOUNTER — Other Ambulatory Visit: Payer: Self-pay | Admitting: Neurology

## 2023-01-19 DIAGNOSIS — M7989 Other specified soft tissue disorders: Secondary | ICD-10-CM

## 2023-01-27 ENCOUNTER — Other Ambulatory Visit: Payer: Self-pay | Admitting: Internal Medicine

## 2023-01-27 DIAGNOSIS — F411 Generalized anxiety disorder: Secondary | ICD-10-CM

## 2023-01-28 ENCOUNTER — Other Ambulatory Visit: Payer: Commercial Managed Care - HMO

## 2023-02-03 ENCOUNTER — Ambulatory Visit: Payer: Self-pay

## 2023-02-03 ENCOUNTER — Ambulatory Visit
Admission: RE | Admit: 2023-02-03 | Discharge: 2023-02-03 | Disposition: A | Discharge status: Home / Self Care | Payer: Commercial Managed Care - HMO | Source: Ambulatory Visit | Attending: Neurology | Admitting: Neurology

## 2023-02-03 ENCOUNTER — Encounter: Payer: Self-pay | Admitting: Internal Medicine

## 2023-02-03 DIAGNOSIS — M7989 Other specified soft tissue disorders: Secondary | ICD-10-CM

## 2023-02-03 NOTE — Telephone Encounter (Signed)
Patient has an appointment scheduled already for 02/04/2023 at 1350 with Dr. Laural Benes.

## 2023-02-03 NOTE — Telephone Encounter (Signed)
Chief Complaint: Blood clot in right lower leg Symptoms: Swelling to right lower leg Frequency: Onset swelling a couple of months Pertinent Negatives: Patient denies pain, other symptoms Disposition: [] ED /[] Urgent Care (no appt availability in office) / [x] Appointment(In office/virtual)/ []  [facility]/ [] Home Care/ [] Refused Recommended Disposition /[] [facility]/ []  Follow-up with PCP Additional Notes: Patient gave the phone to her husband who says her ALS provider Dr. Nedra Hai at [facility] ordered for her to have the US of the leg and they were informed it's a blood clot and to call the PCP for an appointment tomorrow to start treatment. Advised no available appointments with any provider or PCP. Teams message to Cassandra, RN who asked me to call her directly and transfer the call to her, that was done successfully.   Reason for Disposition  [1] MODERATE leg swelling (e.g., swelling extends up to knees) AND [2] new-onset or worsening  Answer Assessment - Initial Assessment Questions 1. ONSET: "When did the swelling start?" (e.g., minutes, hours, days)     A couple of months ago 2. LOCATION: "What part of the leg is swollen?"  "Are both legs swollen or just one leg?"     Lower right leg 3. SEVERITY: "How bad is the swelling?" (e.g., localized; mild, moderate, severe)   - Localized: Small area of swelling localized to one leg.   - MILD pedal edema: Swelling limited to foot and ankle, pitting edema < 1/4 inch (6 mm) deep, rest and elevation eliminate most or all swelling.   - MODERATE edema: Swelling of lower leg to knee, pitting edema > 1/4 inch (6 mm) deep, rest and elevation only partially reduce swelling.   - SEVERE edema: Swelling extends above knee, facial or hand swelling present.      Moderate 4. REDNESS: "Does the swelling look red or infected?"     No 5. PAIN: "Is the swelling painful to touch?" If Yes, ask: "How painful is it?"   (Scale 1-10; mild, moderate or  severe)     No 6. FEVER: "Do you have a fever?" If Yes, ask: "What is it, how was it measured, and when did it start?"      No 7. CAUSE: "What do you think is causing the leg swelling?"     Blood clot diagnosed today 8. MEDICAL HISTORY: "Do you have a history of blood clots (e.g., DVT), cancer, heart failure, kidney disease, or liver failure?"     No 9. RECURRENT SYMPTOM: "Have you had leg swelling before?" If Yes, ask: "When was the last time?" "What happened that time?"     No 10. OTHER SYMPTOMS: "Do you have any other symptoms?" (e.g., chest pain, difficulty breathing)       NO  Protocols used: Leg Swelling and Edema-A-AH

## 2023-02-04 ENCOUNTER — Encounter: Payer: Self-pay | Admitting: Internal Medicine

## 2023-02-04 ENCOUNTER — Ambulatory Visit: Payer: Commercial Managed Care - HMO | Attending: Internal Medicine | Admitting: Internal Medicine

## 2023-02-04 VITALS — BP 127/84 | HR 70 | Ht 63.0 in

## 2023-02-04 DIAGNOSIS — S90454A Superficial foreign body, right lesser toe(s), initial encounter: Secondary | ICD-10-CM | POA: Diagnosis not present

## 2023-02-04 DIAGNOSIS — Z23 Encounter for immunization: Secondary | ICD-10-CM

## 2023-02-04 DIAGNOSIS — I82411 Acute embolism and thrombosis of right femoral vein: Secondary | ICD-10-CM

## 2023-02-04 MED ORDER — ELIQUIS DVT/PE STARTER PACK 5 MG PO TBPK: ORAL_TABLET | ORAL | 0 refills | Status: DC

## 2023-02-04 MED ORDER — APIXABAN 5 MG PO TABS
5.0000 mg | ORAL_TABLET | Freq: Two times a day (BID) | ORAL | 0 refills | Status: DC
Start: 2023-02-04 — End: 2023-06-01

## 2023-02-04 NOTE — Progress Notes (Signed)
Patient ID: Maria Lynch, female    DOB: 08/28/1980  MRN: [id number]  CC: Blood clot (Blood clot found on imaging/Splinter on R toe X 1 week, pain/Yes to flu vax)   Subjective: Maria Lynch is a 42 y.o. female who presents for UC visit.  Her husband Maria Lynch is with her. Her concerns today include:  GERD, GAD, PreDM, fibrocystic breast ds, HL, Motor neuron ds.   Patient requested this visit because she was diagnosed with DVT in the right leg.  Saw her neurologist Dr. Dierdre Searles at [facility] the end of last month.  At that time she reported some pain and swelling in the right ankle/calf.  Doppler ultrasound was ordered which she had done yesterday.  This was positive for DVT in the right femoropopliteal vein.  Patient was told to be seen in the emergency room but she opted to schedule this appointment.  She denies any falls or injury to the leg.  No family or personal history of blood clots.  She is not on any birth control pills or shots.  She flew to [place] in June 16th and returned June 26th.  She noticed the swelling in her leg for about 3 to 4 weeks prior to her visit with her neurologist on 01/11/2023.  She denies any shortness of breath.  Her ALS symptoms continue to progress.  She cannot stand unassisted.  She uses her walker at home and the wheelchair when she has to go out.  She also has a mobility chair but has not started using that as yet.  Complains of having a wood splinter on the plantar surface of the right big toe.  She walks barefooted sometimes on her deck.  She thinks the piece of wood got into the toe from the deck.    Patient Active Problem List   Diagnosis Date Noted   GAD (generalized anxiety disorder) 01/11/2022   ALS (amyotrophic lateral sclerosis) (HCC) 11/19/2021   Atrophy of muscle of multiple sites 11/19/2021   Fasciculations 11/19/2021   Weakness of both arms 11/19/2021   Weakness of both lower limbs 11/19/2021   Gait disturbance 09/11/2021   Weakness of both lower  extremities 09/11/2021   Weakness of hand 09/11/2021   Chronic lumbar radiculopathy 03/06/2021   Hip pain, chronic, left 11/04/2020   Writer's cramp 11/04/2020   Overweight (BMI 25.0-29.9) 11/04/2020   Prediabetes 11/04/2020   Gastroesophageal reflux disease without esophagitis 11/04/2020     Current Outpatient Medications on File Prior to Visit  Medication Sig Dispense Refill   baclofen (LIORESAL) 10 MG tablet 1/2 to 1 tablet 3 times a day for spasticity 30 each 0   busPIRone (BUSPAR) 15 MG tablet TAKE 1 TABLET TWICE A DAY 180 tablet 3   Cyanocobalamin (B-12 PO) Take 2 capsules by mouth daily.     diazepam (VALIUM) 5 MG tablet Take 1 tablet (5 mg total) by mouth every 12 (twelve) hours as needed for anxiety. (Patient taking differently: Take 5 mg by mouth at bedtime.) 60 tablet 3   famotidine (PEPCID) 20 MG tablet Take 20 mg by mouth daily.     gabapentin (NEURONTIN) 100 MG capsule Take by mouth.     ibuprofen (ADVIL) 200 MG tablet Take 400 mg by mouth every 6 (six) hours as needed for headache or cramping.     Multiple Vitamins-Minerals (ADULT GUMMY) CHEW Chew 2 capsules by mouth daily.     riluzole (RILUTEK) 50 MG tablet Take 1 tablet (50 mg total) by mouth  every 12 (twelve) hours. 60 tablet 11   sertraline (ZOLOFT) 50 MG tablet TAKE 1 TABLET DAILY 90 tablet 1   fexofenadine (ALLEGRA) 180 MG tablet Take 180 mg by mouth daily as needed for allergies or rhinitis. (Patient not taking: Reported on 02/04/2023)     No current facility-administered medications on file prior to visit.    Allergies  Allergen Reactions   Other Shortness Of Breath and Swelling    Peach fuzz causes throat swelling   Spinach Shortness Of Breath and Swelling    Throat swells     Social History   Socioeconomic History   Marital status: Married    Spouse name: Not on file   Number of children: 0   Years of education: Not on file   Highest education level: Professional school degree (e.g., MD, DDS, DVM,  JD)  Occupational History   Occupation: minister  Tobacco Use   Smoking status: Never   Smokeless tobacco: Never  Vaping Use   Vaping status: Never Used  Substance and Sexual Activity   Alcohol use: Yes    Alcohol/week: 8.0 - 10.0 standard drinks of alcohol    Types: 8 - 10 Cans of beer per week   Drug use: Yes    Comment: edible marijuana   Sexual activity: Not on file  Other Topics Concern   Not on file  Social History Narrative   Right handed   One story home   Drinks caffeine   Social Determinants of Health   Financial Resource Strain: Low Risk  (09/27/2022)   Overall Financial Resource Strain (CARDIA)    Difficulty of Paying Living Expenses: Not hard at all  Food Insecurity: No Food Insecurity (09/27/2022)   Hunger Vital Sign    Worried About Running Out of Food in the Last Year: Never true    Ran Out of Food in the Last Year: Never true  Transportation Needs: No Transportation Needs (09/27/2022)   PRAPARE - Administrator, Civil Service (Medical): No    Lack of Transportation (Non-Medical): No  Physical Activity: Unknown (09/27/2022)   Exercise Vital Sign    Days of Exercise per Week: 0 days    Minutes of Exercise per Session: Not on file  Stress: Stress Concern Present (09/27/2022)   [employer] of Occupational Health - Occupational Stress Questionnaire    Feeling of Stress : To some extent  Social Connections: Socially Integrated (09/27/2022)   Social Connection and Isolation Panel [NHANES]    Frequency of Communication with Friends and Family: More than three times a week    Frequency of Social Gatherings with Friends and Family: More than three times a week    Attends Religious Services: More than 4 times per year    Active Member of [employer] or Organizations: Yes    Attends Engineer, structural: More than 4 times per year    Marital Status: Married  Catering manager Violence: Not on file    Family History  Problem Relation Age of Onset    Ovarian cancer Maternal Grandmother    Diabetes Maternal Grandfather    Heart disease Paternal Grandmother     Past Surgical History:  Procedure Laterality Date   WISDOM TOOTH EXTRACTION      ROS: Review of Systems Negative except as stated above  PHYSICAL EXAM: BP 127/84 (BP Location: Left Arm, Patient Position: Sitting, Cuff Size: Normal)   Pulse 70   Ht 5\' 3"  (1.6 m)   SpO2 94%  BMI 27.71 kg/m   Physical Exam  General appearance - alert, well appearing, and in no distress.  Patient is seated in a wheelchair. Mental status - normal mood, behavior, speech, dress, motor activity, and thought processes Chest - clear to auscultation, no wheezes, rales or rhonchi, symmetric air entry Heart - normal rate, regular rhythm, normal S1, S2, no murmurs, rubs, clicks or gallops Extremities -right lower extremity has noted nonpitting edema with circumference greater than the left.  No ecchymosis or cyanosis noted. Skin: Noted to have splinter about 0.5 cm in size on the plantar surface of the right big toe     Latest Ref Rng & Units 01/11/2022   10:30 AM 11/04/2020    9:57 AM  CMP  Glucose 65 - 99 mg/dL  89   BUN 6 - 24 mg/dL  12   Creatinine 1.61 - 1.00 mg/dL  0.96   Sodium 045 - 409 mmol/L  138   Potassium 3.5 - 5.2 mmol/L  4.7   Chloride 96 - 106 mmol/L  101   CO2 20 - 29 mmol/L  20   Calcium 8.7 - 10.2 mg/dL  9.9   Total Protein 6.0 - 8.5 g/dL 6.8  7.2   Total Bilirubin 0.0 - 1.2 mg/dL 0.3  0.3   Alkaline Phos 44 - 121 IU/L 58  63   AST 0 - 40 IU/L 23  16   ALT 0 - 32 IU/L 20  20    Lipid Panel     Component Value Date/Time   CHOL 186 11/04/2020 0957   TRIG 121 11/04/2020 0957   HDL 54 11/04/2020 0957   CHOLHDL 3.4 11/04/2020 0957   LDLCALC 110 (H) 11/04/2020 0957    CBC    Component Value Date/Time   WBC 9.0 11/04/2020 0957   RBC 4.53 11/04/2020 0957   HGB 13.9 11/04/2020 0957   HCT 41.4 11/04/2020 0957   PLT 324 11/04/2020 0957   MCV 91 11/04/2020 0957    MCH 30.7 11/04/2020 0957   MCHC 33.6 11/04/2020 0957   RDW 12.5 11/04/2020 0957    ASSESSMENT AND PLAN: 1. Acute deep vein thrombosis (DVT) of femoral vein of right lower extremity (HCC) Contributing factors likely long distance travel and decreased mobility due to ALS. Went over with patient the importance of putting her on an anticoagulant to help clot dissolve and prevent propagation to the lungs.  Will put her on Eliquis with starting dose of 10 mg twice a day for 7 days then 5 mg twice a day thereafter. -Likely would need to remain on Eliquis lifelong since her mobility will likely not improve given her underlying ALS. -Advised of the importance to avoid falls as this can lead to internal bleeding. -Advised to come in if she notices any spontaneous large bruising or has bleeding like epistaxis or rectal bleeding.  Advised that menses may be heavier on blood thinner. -Will check CBC chemistry and hypercoagulable panel workup.  Will refer to hematology for second opinion on length of therapy.  All questions were answered. -Prescription sent to [facility] for starter pack of Eliquis to be picked up today.  Continuation prescription sent to [facility]. - CBC - Basic Metabolic Panel - Hypercoagulable panel, comprehensive - Ambulatory referral to Hematology / Oncology - Apixaban Starter Pack, 10mg  and 5mg , (ELIQUIS DVT/PE STARTER PACK); Take as directed on package: start with two-5mg  tablets twice daily for 7 days. On day 8, switch to one-5mg  tablet twice daily.  Dispense: 69  each; Refill: 0  2. Splinter of toe of right foot, initial encounter Patient requested to have this splinter removed.  I cleansed the area with alcohol swabs then used a surgical blade to make a small split in the skin.  I was able to use a pair of sterile tweezers to remove the splinter.  Patient tolerated the procedure.  Clean dressing applied.  My CMA Josiah Lobo was present to assist me.  3. Encounter for immunization -  Flu vaccine trivalent PF, 6mos and older(Flulaval,Afluria,Fluarix,Fluzone)    Patient was given the opportunity to ask questions.  Patient verbalized understanding of the plan and was able to repeat key elements of the plan.   This documentation was completed using Paediatric nurse.  Any transcriptional errors are unintentional.  Orders Placed This Encounter  Procedures   Flu vaccine trivalent PF, 6mos and older(Flulaval,Afluria,Fluarix,Fluzone)   CBC   Basic Metabolic Panel   Hypercoagulable panel, comprehensive   Ambulatory referral to Hematology / Oncology     Requested Prescriptions   Signed Prescriptions Disp Refills   Apixaban Starter Pack, 10mg  and 5mg , (ELIQUIS DVT/PE STARTER PACK) 74 each 0    Sig: Take as directed on package: start with two-5mg  tablets twice daily for 7 days. On day 8, switch to one-5mg  tablet twice daily.    No follow-ups on file.  Jonah Blue, MD, FACP

## 2023-02-04 NOTE — Patient Instructions (Signed)
Deep Vein Thrombosis  Deep vein thrombosis (DVT) is a condition in which a blood clot forms in a vein of the deep venous system. This can occur in the lower leg, thigh, pelvis, arm, or neck. A clot is blood that has thickened into a gel or solid. This condition is serious and can be life-threatening if the clot travels to the arteries of the lungs and causes a blockage (pulmonary embolism). A DVT can also damage veins in the leg, which can lead to long-term venous disease, leg pain, swelling, discoloration, and ulcers or sores (post-thrombotic syndrome). What are the causes? This condition may be caused by: A slowdown of blood flow. Damage to a vein. A condition that causes blood to clot more easily, such as certain bleeding disorders. What increases the risk? The following factors may make you more likely to develop this condition: Obesity. Being older, especially older than age 47. Being inactive or not moving around (sedentary lifestyle). This may include: Sitting or lying down for longer than 4-6 hours other than to sleep at night. Being in the hospital, or having major or lengthy surgery. Having any recent bone injuries, such as breaks (fractures), that reduce movement, especially in the lower extremities. Having recent orthopedic surgery on the lower extremities. Being pregnant, giving birth, or having recently given birth. Taking medicines that contain estrogen, such as birth control or hormone replacement therapy. Using products that contain nicotine or tobacco, especially if you use hormonal birth control. Having a history of a blood vessel disease (peripheral vascular disease) or congestive heart disease. Having a history of cancer, especially if being treated with chemotherapy. What are the signs or symptoms? Symptoms of this condition include: Swelling, pain, pressure, or tenderness in an arm or a leg. An arm or a leg becoming warm, red, or discolored. A leg turning very pale or  blue. You may have a large DVT. This is rare. If the clot is in your leg, you may notice that symptoms get worse when you stand or walk. In some cases, there are no symptoms. How is this diagnosed? This condition is diagnosed with: Your medical history and a physical exam. Tests, such as: Blood tests to check how well your blood clots. Doppler ultrasound. This is the best way to find a DVT. CT venogram. Contrast dye is injected into a vein, and X-rays are taken to check for clots. This is helpful for veins in the chest or pelvis. How is this treated? Treatment for this condition depends on: The cause of your DVT. The size and location of your DVT, or having more than one DVT. Your risk for bleeding or developing more clots. Other medical conditions you may have. Treatment may include: Taking a blood thinner medicine (anticoagulant) to prevent more clots from forming or current clots from growing. Wearing compression stockings. Injecting medicines into the affected vein to break up the clot (catheter-directed thrombolysis). Surgical procedures, when DVT is severe or hard to treat. These may be done to: Isolate and remove your clot. Place an inferior vena cava (IVC) filter. This filter is placed into a large vein called the inferior vena cava to catch blood clots before they reach your lungs. You may get some medical treatments for 6 months or longer. Follow these instructions at home: If you are taking blood thinners: Talk with your health care provider before you take any medicines that contain aspirin or NSAIDs, such as ibuprofen. These medicines increase your risk for dangerous bleeding. Take your medicine exactly  as told, at the same time every day. Do not skip a dose. Do not take more than the prescribed dose. This is important. Ask your health care provider about foods and medicines that could change or interact with the way your blood thinner works. Avoid these foods and medicines  if you are told to do so. Avoid anything that may cause bleeding or bruising. You may bleed more easily while taking blood thinners. Be very careful when using knives, scissors, or other sharp objects. Use an electric razor instead of a blade. Avoid activities that could cause injury or bruising, and follow instructions for preventing falls. Tell your health care provider if you have had any internal bleeding, bleeding ulcers, or neurologic diseases, such as strokes or cerebral aneurysms. Wear a medical alert bracelet or carry a card that lists what medicines you take. General instructions Take over-the-counter and prescription medicines only as told by your health care provider. Return to your normal activities as told by your health care provider. Ask your health care provider what activities are safe for you. If recommended, wear compression stockings as told by your health care provider. These stockings help to prevent blood clots and reduce swelling in your legs. Never wear your compression stockings while sleeping at night. Keep all follow-up visits. This is important. Where to find more information American Heart Association: www.heart.org Centers for Disease Control and Prevention: FootballExhibition.com.br National Heart, Lung, and Blood Institute: PopSteam.is Contact a health care provider if: You miss a dose of your blood thinner. You have unusual bruising or other color changes. You have new or worse pain, swelling, or redness in an arm or a leg. You have worsening numbness or tingling in an arm or a leg. You have a significant color change (pale or blue) in the extremity that has the DVT. Get help right away if: You have signs or symptoms that a blood clot has moved to the lungs. These may include: Shortness of breath. Chest pain. Fast or irregular heartbeats (palpitations). Light-headedness, dizziness, or fainting. Coughing up blood. You have signs or symptoms that your blood is  too thin. These may include: Blood in your vomit, stool, or urine. A cut that will not stop bleeding. A menstrual period that is heavier than usual. A severe headache or confusion. These symptoms may be an emergency. Get help right away. Call 911. Do not wait to see if the symptoms will go away. Do not drive yourself to the hospital. Summary Deep vein thrombosis (DVT) happens when a blood clot forms in a deep vein. This may occur in the lower leg, thigh, pelvis, arm, or neck. Symptoms affect the arm or leg and can include swelling, pain, tenderness, warmth, redness, or discoloration. This condition may be treated with medicines. In severe cases, a procedure or surgery may be done to remove or dissolve the clots. If you are taking blood thinners, take them exactly as told. Do not skip a dose. Do not take more than is prescribed. Get help right away if you have a severe headache, shortness of breath, chest pain, fast or irregular heartbeats, or blood in your vomit, urine, or stool. This information is not intended to replace advice given to you by your health care provider. Make sure you discuss any questions you have with your health care provider. Document Revised: 11/24/2020 Document Reviewed: 11/24/2020 Elsevier Patient Education  2024 [employer].

## 2023-02-05 LAB — HYPERCOAGULABLE PANEL, COMPREHENSIVE

## 2023-02-05 LAB — BASIC METABOLIC PANEL
BUN/Creatinine Ratio: 20 (ref 9–23)
BUN: 12 mg/dL (ref 6–24)
CO2: 23 mmol/L (ref 20–29)
Calcium: 9.5 mg/dL (ref 8.7–10.2)
Chloride: 103 mmol/L (ref 96–106)
Creatinine, Ser: 0.59 mg/dL (ref 0.57–1.00)
Glucose: 88 mg/dL (ref 70–99)
Potassium: 4.5 mmol/L (ref 3.5–5.2)
Sodium: 141 mmol/L (ref 134–144)
eGFR: 115 mL/min/{1.73_m2} (ref >59)

## 2023-02-05 LAB — CBC
Hematocrit: 37.2 % (ref 34.0–46.6)
Hemoglobin: 11.9 g/dL (ref 11.1–15.9)
MCH: 30.3 pg (ref 26.6–33.0)
MCHC: 32 g/dL (ref 31.5–35.7)
MCV: 95 fL (ref 79–97)
Platelets: 377 10*3/uL (ref 150–450)
RBC: 3.93 x10E6/uL (ref 3.77–5.28)
RDW: 11.9 % (ref 11.7–15.4)
WBC: 8.9 10*3/uL (ref 3.4–10.8)

## 2023-02-14 ENCOUNTER — Encounter: Payer: Self-pay | Admitting: Internal Medicine

## 2023-02-15 ENCOUNTER — Encounter: Payer: Self-pay | Admitting: Internal Medicine

## 2023-02-15 ENCOUNTER — Telehealth: Payer: Self-pay

## 2023-02-15 ENCOUNTER — Other Ambulatory Visit: Payer: Self-pay

## 2023-02-15 NOTE — Telephone Encounter (Signed)
ELIQUIS APPROVAL DATES

## 2023-02-15 NOTE — Telephone Encounter (Signed)
Pharmacy Patient Advocate Encounter  Received notification from [employer] that Prior Authorization for Everlene Balls has been APPROVED from 02/15/2023 to 02/15/2024   PA #/Case ID/Reference #: [id number]

## 2023-02-16 ENCOUNTER — Other Ambulatory Visit: Payer: Self-pay | Admitting: Internal Medicine

## 2023-02-16 DIAGNOSIS — N921 Excessive and frequent menstruation with irregular cycle: Secondary | ICD-10-CM

## 2023-02-17 ENCOUNTER — Other Ambulatory Visit: Payer: Self-pay

## 2023-03-01 ENCOUNTER — Other Ambulatory Visit: Payer: Self-pay

## 2023-03-02 ENCOUNTER — Encounter: Payer: Self-pay | Admitting: Internal Medicine

## 2023-03-09 ENCOUNTER — Inpatient Hospital Stay: Payer: Managed Care, Other (non HMO) | Attending: Hematology | Admitting: Hematology

## 2023-03-09 ENCOUNTER — Encounter: Payer: Self-pay | Admitting: Hematology

## 2023-03-09 ENCOUNTER — Inpatient Hospital Stay: Payer: Managed Care, Other (non HMO)

## 2023-03-09 VITALS — BP 124/82 | HR 64 | Temp 98.4°F | Resp 18

## 2023-03-09 DIAGNOSIS — Z993 Dependence on wheelchair: Secondary | ICD-10-CM

## 2023-03-09 DIAGNOSIS — K219 Gastro-esophageal reflux disease without esophagitis: Secondary | ICD-10-CM | POA: Diagnosis not present

## 2023-03-09 DIAGNOSIS — F419 Anxiety disorder, unspecified: Secondary | ICD-10-CM | POA: Diagnosis not present

## 2023-03-09 DIAGNOSIS — Z8249 Family history of ischemic heart disease and other diseases of the circulatory system: Secondary | ICD-10-CM

## 2023-03-09 DIAGNOSIS — Z800 Family history of malignant neoplasm of digestive organs: Secondary | ICD-10-CM

## 2023-03-09 DIAGNOSIS — I82401 Acute embolism and thrombosis of unspecified deep veins of right lower extremity: Secondary | ICD-10-CM

## 2023-03-09 DIAGNOSIS — Z808 Family history of malignant neoplasm of other organs or systems: Secondary | ICD-10-CM

## 2023-03-09 DIAGNOSIS — Z7289 Other problems related to lifestyle: Secondary | ICD-10-CM | POA: Diagnosis not present

## 2023-03-09 DIAGNOSIS — Z79899 Other long term (current) drug therapy: Secondary | ICD-10-CM

## 2023-03-09 DIAGNOSIS — Z7901 Long term (current) use of anticoagulants: Secondary | ICD-10-CM

## 2023-03-09 DIAGNOSIS — R609 Edema, unspecified: Secondary | ICD-10-CM | POA: Diagnosis not present

## 2023-03-09 DIAGNOSIS — Z8041 Family history of malignant neoplasm of ovary: Secondary | ICD-10-CM | POA: Diagnosis not present

## 2023-03-09 DIAGNOSIS — F32A Depression, unspecified: Secondary | ICD-10-CM

## 2023-03-09 DIAGNOSIS — I82451 Acute embolism and thrombosis of right peroneal vein: Secondary | ICD-10-CM

## 2023-03-09 DIAGNOSIS — I82409 Acute embolism and thrombosis of unspecified deep veins of unspecified lower extremity: Secondary | ICD-10-CM | POA: Insufficient documentation

## 2023-03-09 DIAGNOSIS — G1221 Amyotrophic lateral sclerosis: Secondary | ICD-10-CM

## 2023-03-09 DIAGNOSIS — Z833 Family history of diabetes mellitus: Secondary | ICD-10-CM

## 2023-03-09 DIAGNOSIS — M79606 Pain in leg, unspecified: Secondary | ICD-10-CM | POA: Diagnosis not present

## 2023-03-09 NOTE — Progress Notes (Signed)
[facility]   [phone] Fax:([phone]   Clinic New Consult Note   Patient Care Team: Marcine Matar, MD as PCP - General (Internal Medicine) 03/09/2023  CHIEF COMPLAINTS/PURPOSE OF CONSULTATION:  DVT in right lower extremity  Referring physician: Marcine Matar, MD   Discussed the use of AI scribe software for clinical note transcription with the patient, who gave verbal consent to proceed.  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  She came in a wheelchair and is accompanied by her husband to the clinic today.  She was referred by her PCP.    The patient has been experiencing mobility issues due to ALS and spends most of her time sitting in a wheelchair. She reports no trouble with breathing and uses a ventilator at night as a preventative measure. She also uses a breath-strengthening tool daily.  The patient has been experiencing swelling and pain in her legs, which she noticed around four to six weeks prior to the ultrasound ON 02/03/2023 that confirmed the DVT. The swelling and pain are more pronounced on the right side. Despite being on Eliquis for a month, the patient reports that the swelling has not significantly reduced.  In addition to ALS and DVT, the patient has GERD, anxiety, and depression. 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 She does some stretches, particularly for her shoulder, and exercises her legs as recommended by her physical therapist.  The patient has a history of air travel, having flown to [place] in June to visit her family. She did not notice any swelling or pain at that time. She also reports no recent weight loss and is on a high-calorie diet as recommended by her healthcare providers.  She has never smoked, not on birth control..  No family history of  thrombosis.      MEDICAL HISTORY:  Past Medical History:  Diagnosis Date   GERD (gastroesophageal reflux disease)    Prediabetes     SURGICAL HISTORY: Past Surgical History:  Procedure Laterality Date   WISDOM TOOTH EXTRACTION      SOCIAL HISTORY: Social History   Socioeconomic History   Marital status: Married    Spouse name: Not on file   Number of children: 0   Years of education: Not on file   Highest education level: Professional school degree (e.g., MD, DDS, DVM, JD)  Occupational History   Occupation: minister  Tobacco Use   Smoking status: Never   Smokeless tobacco: Never  Vaping Use   Vaping status: Never Used  Substance and Sexual Activity   Alcohol use: Yes    Alcohol/week: 8.0 - 10.0 standard drinks of alcohol    Types: 8 - 10 Cans of beer per week   Drug use: Yes    Comment: edible marijuana   Sexual activity: Not on file  Other Topics Concern   Not on file  Social History Narrative   Right handed   One story home   Drinks caffeine   Social Determinants of Health   Financial Resource Strain: Low Risk  (09/27/2022)   Overall Financial Resource Strain (CARDIA)    Difficulty of Paying Living Expenses: Not hard at all  Food Insecurity: No Food Insecurity (09/27/2022)   Hunger Vital Sign    Worried About Running Out of Food in the Last  Year: Never true    Ran Out of Food in the Last Year: Never true  Transportation Needs: No Transportation Needs (09/27/2022)   PRAPARE - Administrator, Civil Service (Medical): No    Lack of Transportation (Non-Medical): No  Physical Activity: Unknown (09/27/2022)   Exercise Vital Sign    Days of Exercise per Week: 0 days    Minutes of Exercise per Session: Not on file  Stress: Stress Concern Present (09/27/2022)   [employer] of Occupational Health - Occupational Stress Questionnaire    Feeling of Stress : To some extent  Social Connections: Socially Integrated (09/27/2022)   Social Connection and  Isolation Panel [NHANES]    Frequency of Communication with Friends and Family: More than three times a week    Frequency of Social Gatherings with Friends and Family: More than three times a week    Attends Religious Services: More than 4 times per year    Active Member of [employer] or Organizations: Yes    Attends Engineer, structural: More than 4 times per year    Marital Status: Married  Catering manager Violence: Not on file    FAMILY HISTORY: Family History  Problem Relation Age of Onset   Cancer Mother        BLOOD CANCER   Cancer Maternal Grandmother        colon cancer   Ovarian cancer Maternal Grandmother    Diabetes Maternal Grandfather    Heart disease Paternal Grandmother     ALLERGIES:  is allergic to other and spinach.  MEDICATIONS:  Current Outpatient Medications  Medication Sig Dispense Refill   apixaban (ELIQUIS) 5 MG TABS tablet Take 1 tablet (5 mg total) by mouth 2 (two) times daily. 180 tablet 0   Apixaban Starter Pack, 10mg  and 5mg , (ELIQUIS DVT/PE STARTER PACK) Take as directed on package: start with two-5mg  tablets twice daily for 7 days. On day 8, switch to one-5mg  tablet twice daily. 74 each 0   baclofen (LIORESAL) 10 MG tablet 1/2 to 1 tablet 3 times a day for spasticity 30 each 0   busPIRone (BUSPAR) 15 MG tablet TAKE 1 TABLET TWICE A DAY 180 tablet 3   Cyanocobalamin (B-12 PO) Take 2 capsules by mouth daily.     famotidine (PEPCID) 20 MG tablet Take 20 mg by mouth daily.     fexofenadine (ALLEGRA) 180 MG tablet Take 180 mg by mouth daily as needed for allergies or rhinitis. (Patient not taking: Reported on 02/04/2023)     gabapentin (NEURONTIN) 100 MG capsule Take by mouth.     ibuprofen (ADVIL) 200 MG tablet Take 400 mg by mouth every 6 (six) hours as needed for headache or cramping.     Multiple Vitamins-Minerals (ADULT GUMMY) CHEW Chew 2 capsules by mouth daily.     riluzole (RILUTEK) 50 MG tablet Take 1 tablet (50 mg total) by mouth every 12  (twelve) hours. 60 tablet 11   sertraline (ZOLOFT) 50 MG tablet TAKE 1 TABLET DAILY 90 tablet 1   No current facility-administered medications for this visit.    REVIEW OF SYSTEMS:   Constitutional: Denies fevers, chills or abnormal night sweats Eyes: Denies blurriness of vision, double vision or watery eyes Ears, nose, mouth, throat, and face: Denies mucositis or sore throat Respiratory: Denies cough, dyspnea or wheezes Cardiovascular: Denies palpitation, chest discomfort or lower extremity swelling Gastrointestinal:  Denies nausea, heartburn or change in bowel habits Skin: Denies abnormal skin rashes Lymphatics: Denies new  lymphadenopathy or easy bruising Neurological:Denies numbness, tingling or new weaknesses Behavioral/Psych: Mood is stable, no new changes  All other systems were reviewed with the patient and are negative.  PHYSICAL EXAMINATION: ECOG PERFORMANCE STATUS: 3 - Symptomatic, >50% confined to bed  Vitals:   03/09/23 1510  BP: 124/82  Pulse: 64  Resp: 18  Temp: 98.4 F (36.9 C)  SpO2: 98%   There were no vitals filed for this visit.  GENERAL:alert, no distress and comfortable SKIN: skin color, texture, turgor are normal, no rashes or significant lesions EYES: normal, conjunctiva are pink and non-injected, sclera clear OROPHARYNX:no exudate, no erythema and lips, buccal mucosa, and tongue normal  NECK: supple, thyroid normal size, non-tender, without nodularity LYMPH:  no palpable lymphadenopathy in the cervical, axillary or inguinal LUNGS: clear to auscultation and percussion with normal breathing effort HEART: regular rate & rhythm and no murmurs and no lower extremity edema ABDOMEN:abdomen soft, non-tender and normal bowel sounds Musculoskeletal:no cyanosis of digits and no clubbing  PSYCH: alert & oriented x 3 with fluent speech NEURO: no focal motor/sensory deficits  LABORATORY DATA:  I have reviewed the data as listed    Latest Ref Rng & Units  02/04/2023    2:51 PM 11/04/2020    9:57 AM  CBC  WBC 3.4 - 10.8 x10E3/uL 8.9  9.0   Hemoglobin 11.1 - 15.9 g/dL 64.3  32.9   Hematocrit 34.0 - 46.6 % 37.2  41.4   Platelets 150 - 450 x10E3/uL 377  324       Latest Ref Rng & Units 02/04/2023    2:51 PM 01/11/2022   10:30 AM 11/04/2020    9:57 AM  CMP  Glucose 70 - 99 mg/dL 88   89   BUN 6 - 24 mg/dL 12   12   Creatinine 5.18 - 1.00 mg/dL 8.41   6.60   Sodium 630 - 144 mmol/L 141   138   Potassium 3.5 - 5.2 mmol/L 4.5   4.7   Chloride 96 - 106 mmol/L 103   101   CO2 20 - 29 mmol/L 23   20   Calcium 8.7 - 10.2 mg/dL 9.5   9.9   Total Protein 6.0 - 8.5 g/dL  6.8  7.2   Total Bilirubin 0.0 - 1.2 mg/dL  0.3  0.3   Alkaline Phos 44 - 121 IU/L  58  63   AST 0 - 40 IU/L  23  16   ALT 0 - 32 IU/L  20  20      RADIOGRAPHIC STUDIES: I have personally reviewed the radiological images as listed and agreed with the findings in the report. No results found.  ASSESSMENT & PLAN:     Deep Vein Thrombosis (DVT) Recent diagnosis of DVT in the right leg, likely secondary to decreased mobility due to ALS and recent air travel. Patient reports persistent swelling and tenderness despite being on Eliquis for a month. -Continue Eliquis 5mg  twice daily.  I recommend anticoagulation indefinitely due to her significantly reduced mobility. -Encourage use of compression stockings and leg elevation to reduce swelling. -Advise patient to avoid NSAIDs due to increased risk of bleeding on Eliquis. -Advise patient to seek immediate medical attention if experiencing new symptoms suggestive of a NEW blood clot. -We also discussed other strategies to reduce her risk of thrombosis, such as avoiding birth control pill and smoking. -We also discussed the role of genetic testing to rule out inheritable thrombophilia.  Given her provoked DVT,  no family history of thrombosis, her needs for lifelong anticoagulation and that she has no children, I do not recommend genetic  testing.  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 Engaging in physical therapy for frozen shoulder and performing daily stretches and exercises as able. -Continue current management plan for ALS at [facility]. -Encourage continuation of physical therapy and daily exercises.  Gastroesophageal Reflux Disease (GERD) Patient reports a history of GERD. -Continue current management plan for GERD.  Anxiety and Depression Patient reports a history of anxiety and depression. -Continue current management plan for anxiety and depression.  Plan -I recommend anticoagulation indefinitely, she is tolerating Eliquis well, and will continue. -I will see her as needed.       All questions were answered. The patient knows to call the clinic with any problems, questions or concerns. I spent 25 minutes counseling the patient face to face. The total time spent in the appointment was 30 minutes and more than 50% was on counseling.     Malachy Mood, MD 03/09/2023 5:34 PM

## 2023-03-16 ENCOUNTER — Telehealth: Payer: Self-pay | Admitting: Internal Medicine

## 2023-03-16 NOTE — Telephone Encounter (Signed)
-----   Message from [place] Blas sent at 03/09/2023  4:11 PM EDT ----- Regarding: RE: GYN The place that she was  refer   [facility] [address]  [address]  [place], [place] [address]  Ph# [phone] ----- Message ----- From: Marcine Matar, MD Sent: 03/08/2023   5:57 PM EDT To: Dionne Bucy Subject: RE: GYN                                        What is [facility]? ----- Message ----- From: Dionne Bucy Sent: 03/08/2023   5:17 PM EDT To: Marcine Matar, MD Subject: RE: GYN                                        10/16  [facility]/Left message for patient to call and schedule appointment. ----- Message ----- From: Marcine Matar, MD Sent: 02/16/2023   9:37 AM EDT To: Dionne Bucy Subject: GYN                                            Please try to get her in with GYN ASAP.

## 2023-03-23 ENCOUNTER — Ambulatory Visit: Payer: Managed Care, Other (non HMO) | Admitting: Sports Medicine

## 2023-03-23 ENCOUNTER — Encounter: Payer: Self-pay | Admitting: Internal Medicine

## 2023-03-25 ENCOUNTER — Ambulatory Visit: Payer: Managed Care, Other (non HMO) | Admitting: Sports Medicine

## 2023-03-25 ENCOUNTER — Other Ambulatory Visit: Payer: Self-pay

## 2023-03-25 DIAGNOSIS — M25511 Pain in right shoulder: Secondary | ICD-10-CM | POA: Diagnosis not present

## 2023-03-25 DIAGNOSIS — G1221 Amyotrophic lateral sclerosis: Secondary | ICD-10-CM | POA: Diagnosis not present

## 2023-03-25 DIAGNOSIS — G8929 Other chronic pain: Secondary | ICD-10-CM

## 2023-03-25 DIAGNOSIS — M7501 Adhesive capsulitis of right shoulder: Secondary | ICD-10-CM

## 2023-03-25 MED ORDER — METHYLPREDNISOLONE ACETATE 40 MG/ML IJ SUSP
40.0000 mg | INTRAMUSCULAR | Status: AC | PRN
  Administered 2023-03-25: 40 mg via INTRA_ARTICULAR

## 2023-03-25 MED ORDER — BUPIVACAINE HCL 0.25 % IJ SOLN
3.0000 mL | INTRAMUSCULAR | Status: AC | PRN
Start: 2023-03-25 — End: 2023-03-25
  Administered 2023-03-25: 3 mL via INTRA_ARTICULAR

## 2023-03-25 MED ORDER — LIDOCAINE HCL 1 % IJ SOLN
3.0000 mL | INTRAMUSCULAR | Status: AC | PRN
Start: 2023-03-25 — End: 2023-03-25
  Administered 2023-03-25: 3 mL

## 2023-03-25 NOTE — Progress Notes (Signed)
Maria Lynch - 42 y.o. female MRN [id number]  Date of birth: [birth year]  Office Visit Note: Visit Date: 03/25/2023 PCP: Marcine Matar, MD Referred by: Marcine Matar, MD  Subjective: Chief Complaint  Patient presents with   Right Shoulder - Pain    Injection   HPI: Maria Lynch is a pleasant 42 y.o. female who presents today for follow-up of acute on chronic right shoulder pain with history of adhesive capsulitis.  She is having reoccurrence of her pain as well as restriction in the right shoulder.  She did have an intra-articular injection in July and then on 12/21/2022 which was larger volume for her frozen shoulder and this gave her good pain relief as well as help her increase her range of motion through therapy.  She is continuing formal therapy at home as well as her home exercises but here over the past few weeks feels like she is taking steps backward.  She uses Tylenol as needed.  Pertinent ROS were reviewed with the patient and found to be negative unless otherwise specified above in HPI.   Assessment & Plan: Visit Diagnoses:  1. Adhesive capsulitis of right shoulder   2. Chronic right shoulder pain    Plan: Maria Lynch has had an exacerbation and a setback in terms of her chronic right shoulder pain with adhesive capsulitis.  Her range of motion was making progress after our last injection and her physical therapy but this is starting to diminish.  I do think some of her shoulder strength is being lost as well given her ALS, but her adhesive capsulitis is certainly limiting her motion and the main contributor of her pain.  Through shared decision making, we did proceed with high-volume glenohumeral joint injection, patient tolerated well. May use ice/heat and/or Tylenol for any postinjection pain. She will take some modified rest for the first 48-72 hours from injection, but then I would like her to continue both her formalized physical therapy and home exercises to help  maintain and improve her range of motion. Will notify me of improvement over the next month or so.  Follow-up: Return if symptoms worsen or fail to improve.   Meds & Orders: No orders of the defined types were placed in this encounter.   Orders Placed This Encounter  Procedures   Large Joint Inj   US Guided Needle Placement - No Linked Charges     Procedures: Large Joint Inj: R glenohumeral on 03/25/2023 3:54 PM Indications: pain Details: 22 G 3.5 in needle, ultrasound-guided posterior approach Medications: 3 mL lidocaine 1 %; 3 mL bupivacaine 0.25 %; 40 mg methylPREDNISolone acetate 40 MG/ML (13 mL's of D5W) Outcome: tolerated well, no immediate complications  US-guided glenohumeral joint injection, right shoulder After discussion on risks/benefits/indications, informed verbal consent was obtained. A timeout was then performed. The patient was positioned lying lateral recumbent on examination table. The patient's shoulder was prepped with betadine and multiple alcohol swabs and utilizing ultrasound guidance, the patient's glenohumeral joint was identified on ultrasound. Using ultrasound guidance a 22-gauge, 3.5 inch needle with a mixture of 3:3:1:13 cc's lidocaine:bupivicaine:depomedrol:D5W (total of 18cc of injectate for high-volume) was directed from a lateral to medial direction via in-plane technique into the glenohumeral joint with visualization of appropriate spread of injectate into the joint. Patient tolerated the procedure well without immediate complications.      Procedure, treatment alternatives, risks and benefits explained, specific risks discussed. Consent was given by the patient. Immediately prior to procedure a time out was  called to verify the correct patient, procedure, equipment, support staff and site/side marked as required. Patient was prepped and draped in the usual sterile fashion.         Clinical History: No specialty comments available.  She reports that  she has never smoked. She has never used smokeless tobacco. No results for input(s): "HGBA1C", "LABURIC" in the last 8760 hours.  Objective:    Physical Exam  Gen: Well-appearing, in no acute distress; non-toxic CV:  Well-perfused. Warm.  Resp: Breathing unlabored on room air; no wheezing. Psych: Fluid speech in conversation; appropriate affect; normal thought process Neuro: Sensation intact throughout. No gross coordination deficits.   Ortho Exam - Right shoulder: No specific AC joint or bony tenderness to palpation.  There is limitation in both active and passive range of motion in all directions.  Forward flexion to approximately 90 degrees and abduction to 100 degrees, I am able to take her slightly further passively but still has restriction.  External rotation to about 45 degrees.  Internal range of motion is restricted with thumb to L5 compared to T11 of the contralateral arm.  Imaging: No results found.  Past Medical/Family/Surgical/Social History: Medications & Allergies reviewed per EMR, new medications updated. Patient Active Problem List   Diagnosis Date Noted   DVT (deep venous thrombosis) (HCC) 03/09/2023   GAD (generalized anxiety disorder) 01/11/2022   ALS (amyotrophic lateral sclerosis) (HCC) 11/19/2021   Atrophy of muscle of multiple sites 11/19/2021   Fasciculations 11/19/2021   Weakness of both arms 11/19/2021   Weakness of both lower limbs 11/19/2021   Gait disturbance 09/11/2021   Weakness of both lower extremities 09/11/2021   Weakness of hand 09/11/2021   Chronic lumbar radiculopathy 03/06/2021   Hip pain, chronic, left 11/04/2020   Writer's cramp 11/04/2020   Overweight (BMI 25.0-29.9) 11/04/2020   Prediabetes 11/04/2020   Gastroesophageal reflux disease without esophagitis 11/04/2020   Past Medical History:  Diagnosis Date   GERD (gastroesophageal reflux disease)    Prediabetes    Family History  Problem Relation Age of Onset   Cancer Mother         BLOOD CANCER   Cancer Maternal Grandmother        colon cancer   Ovarian cancer Maternal Grandmother    Diabetes Maternal Grandfather    Heart disease Paternal Grandmother    Past Surgical History:  Procedure Laterality Date   WISDOM TOOTH EXTRACTION     Social History   Occupational History   Occupation: Optician, dispensing  Tobacco Use   Smoking status: Never   Smokeless tobacco: Never  Vaping Use   Vaping status: Never Used  Substance and Sexual Activity   Alcohol use: Yes    Alcohol/week: 8.0 - 10.0 standard drinks of alcohol    Types: 8 - 10 Cans of beer per week   Drug use: Yes    Comment: edible marijuana   Sexual activity: Not on file

## 2023-04-04 ENCOUNTER — Ambulatory Visit: Payer: Managed Care, Other (non HMO) | Attending: Internal Medicine | Admitting: Internal Medicine

## 2023-04-04 ENCOUNTER — Encounter: Payer: Self-pay | Admitting: Internal Medicine

## 2023-04-04 VITALS — BP 121/84 | HR 83 | Temp 98.3°F | Ht 63.0 in

## 2023-04-04 DIAGNOSIS — Z86718 Personal history of other venous thrombosis and embolism: Secondary | ICD-10-CM

## 2023-04-04 DIAGNOSIS — N920 Excessive and frequent menstruation with regular cycle: Secondary | ICD-10-CM | POA: Diagnosis not present

## 2023-04-04 DIAGNOSIS — G1221 Amyotrophic lateral sclerosis: Secondary | ICD-10-CM

## 2023-04-04 NOTE — Progress Notes (Signed)
Patient ID: Maria Lynch, female    DOB: [birth year]  MRN: [id number]  CC: PPD Reading (Follow-up. Franchot Erichsen gynecologist Dallie Piles received flu vax.)   Subjective: Maria Lynch is a 42 y.o. female who presents for chronic ds management.  Husband is with her. Her concerns today include:  GERD, GAD, PreDM, fibrocystic breast ds, HL, Motor neuron ds.   Since last visit, patient has seen oncologist Dr. Mosetta Putt.  She recommends indefinite use of Eliquis for DVT given patient's decreased mobility.  Patient is doing okay on the Eliquis.  Menses have become heavier and last 5 days.  Previously used to last 3 days.  She is taking iron supplement daily.  Last H/H in September was 11.9/37.2.  I had preemptively referred her to gynecology in anticipation of the heavy menses.  However patient states she wants to hold off on that for now.  Cycles are heavy but stable. She has seen Dr. Shon Baton on 11/8 and had injection to the right shoulder.  She finds this to be helpful. ALS: She now uses a respirator at night.  Now uses her mobility chair.  She follows up with [facility] neurology/ALS clinic every quarter.  Still on Riluzole.  She has not had any falls.   Patient Active Problem List   Diagnosis Date Noted   DVT (deep venous thrombosis) (HCC) 03/09/2023   GAD (generalized anxiety disorder) 01/11/2022   ALS (amyotrophic lateral sclerosis) (HCC) 11/19/2021   Atrophy of muscle of multiple sites 11/19/2021   Fasciculations 11/19/2021   Weakness of both arms 11/19/2021   Weakness of both lower limbs 11/19/2021   Gait disturbance 09/11/2021   Weakness of both lower extremities 09/11/2021   Weakness of hand 09/11/2021   Chronic lumbar radiculopathy 03/06/2021   Hip pain, chronic, left 11/04/2020   Writer's cramp 11/04/2020   Overweight (BMI 25.0-29.9) 11/04/2020   Prediabetes 11/04/2020   Gastroesophageal reflux disease without esophagitis 11/04/2020     Current Outpatient Medications on File Prior to Visit   Medication Sig Dispense Refill   apixaban (ELIQUIS) 5 MG TABS tablet Take 1 tablet (5 mg total) by mouth 2 (two) times daily. 180 tablet 0   Apixaban Starter Pack, 10mg  and 5mg , (ELIQUIS DVT/PE STARTER PACK) Take as directed on package: start with two-5mg  tablets twice daily for 7 days. On day 8, switch to one-5mg  tablet twice daily. 74 each 0   baclofen (LIORESAL) 10 MG tablet 1/2 to 1 tablet 3 times a day for spasticity 30 each 0   busPIRone (BUSPAR) 15 MG tablet TAKE 1 TABLET TWICE A DAY 180 tablet 3   Cyanocobalamin (B-12 PO) Take 2 capsules by mouth daily.     famotidine (PEPCID) 20 MG tablet Take 20 mg by mouth daily.     fexofenadine (ALLEGRA) 180 MG tablet Take 180 mg by mouth daily as needed for allergies or rhinitis.     gabapentin (NEURONTIN) 100 MG capsule Take by mouth.     ibuprofen (ADVIL) 200 MG tablet Take 400 mg by mouth every 6 (six) hours as needed for headache or cramping.     Multiple Vitamins-Minerals (ADULT GUMMY) CHEW Chew 2 capsules by mouth daily.     riluzole (RILUTEK) 50 MG tablet Take 1 tablet (50 mg total) by mouth every 12 (twelve) hours. 60 tablet 11   sertraline (ZOLOFT) 50 MG tablet TAKE 1 TABLET DAILY 90 tablet 1   No current facility-administered medications on file prior to visit.    Allergies  Allergen Reactions  Other Shortness Of Breath and Swelling    Peach fuzz causes throat swelling   Spinach Shortness Of Breath and Swelling    Throat swells     Social History   Socioeconomic History   Marital status: Married    Spouse name: Not on file   Number of children: 0   Years of education: Not on file   Highest education level: Professional school degree (e.g., MD, DDS, DVM, JD)  Occupational History   Occupation: minister  Tobacco Use   Smoking status: Never   Smokeless tobacco: Never  Vaping Use   Vaping status: Never Used  Substance and Sexual Activity   Alcohol use: Yes    Alcohol/week: 8.0 - 10.0 standard drinks of alcohol     Types: 8 - 10 Cans of beer per week   Drug use: Yes    Comment: edible marijuana   Sexual activity: Not on file  Other Topics Concern   Not on file  Social History Narrative   Right handed   One story home   Drinks caffeine   Social Determinants of Health   Financial Resource Strain: Low Risk  (03/31/2023)   Overall Financial Resource Strain (CARDIA)    Difficulty of Paying Living Expenses: Not hard at all  Food Insecurity: No Food Insecurity (03/31/2023)   Hunger Vital Sign    Worried About Running Out of Food in the Last Year: Never true    Ran Out of Food in the Last Year: Never true  Transportation Needs: No Transportation Needs (03/31/2023)   PRAPARE - Administrator, Civil Service (Medical): No    Lack of Transportation (Non-Medical): No  Physical Activity: Unknown (03/31/2023)   Exercise Vital Sign    Days of Exercise per Week: 0 days    Minutes of Exercise per Session: Not on file  Stress: Stress Concern Present (03/31/2023)   [employer] of Occupational Health - Occupational Stress Questionnaire    Feeling of Stress : To some extent  Social Connections: Socially Integrated (03/31/2023)   Social Connection and Isolation Panel [NHANES]    Frequency of Communication with Friends and Family: More than three times a week    Frequency of Social Gatherings with Friends and Family: Three times a week    Attends Religious Services: More than 4 times per year    Active Member of Clubs or Organizations: Yes    Attends Engineer, structural: More than 4 times per year    Marital Status: Married  Catering manager Violence: Not on file    Family History  Problem Relation Age of Onset   Cancer Mother        BLOOD CANCER   Cancer Maternal Grandmother        colon cancer   Ovarian cancer Maternal Grandmother    Diabetes Maternal Grandfather    Heart disease Paternal Grandmother     Past Surgical History:  Procedure Laterality Date   WISDOM TOOTH  EXTRACTION      ROS: Review of Systems Negative except as stated above  PHYSICAL EXAM: BP 121/84 (BP Location: Left Arm, Patient Position: Sitting, Cuff Size: Normal)   Pulse 83   Temp 98.3 F (36.8 C) (Oral)   Ht 5\' 3"  (1.6 m)   SpO2 95%   BMI 27.71 kg/m   Physical Exam  General appearance - alert, well appearing, middle-age Caucasian female and in no distress.  Patient sitting in wheelchair. Mental status -patient is oriented.  Speech slightly  slurred. Neck -no cervical lymphadenopathy. Chest -chest is clear bilaterally. Heart - normal rate, regular rhythm, normal S1, S2, no murmurs, rubs, clicks or gallops      Latest Ref Rng & Units 02/04/2023    2:51 PM 01/11/2022   10:30 AM 11/04/2020    9:57 AM  CMP  Glucose 70 - 99 mg/dL 88   89   BUN 6 - 24 mg/dL 12   12   Creatinine 1.19 - 1.00 mg/dL 1.47   8.29   Sodium 562 - 144 mmol/L 141   138   Potassium 3.5 - 5.2 mmol/L 4.5   4.7   Chloride 96 - 106 mmol/L 103   101   CO2 20 - 29 mmol/L 23   20   Calcium 8.7 - 10.2 mg/dL 9.5   9.9   Total Protein 6.0 - 8.5 g/dL  6.8  7.2   Total Bilirubin 0.0 - 1.2 mg/dL  0.3  0.3   Alkaline Phos 44 - 121 IU/L  58  63   AST 0 - 40 IU/L  23  16   ALT 0 - 32 IU/L  20  20    Lipid Panel     Component Value Date/Time   CHOL 186 11/04/2020 0957   TRIG 121 11/04/2020 0957   HDL 54 11/04/2020 0957   CHOLHDL 3.4 11/04/2020 0957   LDLCALC 110 (H) 11/04/2020 0957    CBC    Component Value Date/Time   WBC 8.9 02/04/2023 1451   RBC 3.93 02/04/2023 1451   HGB 11.9 02/04/2023 1451   HCT 37.2 02/04/2023 1451   PLT 377 02/04/2023 1451   MCV 95 02/04/2023 1451   MCH 30.3 02/04/2023 1451   MCHC 32.0 02/04/2023 1451   RDW 11.9 02/04/2023 1451    ASSESSMENT AND PLAN: 1. History of DVT (deep vein thrombosis) Plan is for lifelong anticoagulation due to decreased mobility.  She will continue Eliquis.  2. ALS (amyotrophic lateral sclerosis) (HCC) Slow decline.  She reports that she now  uses her mobility chair and sleeps with the ventilator at night.  Followed by [facility].  3. Menorrhagia with regular cycle Patient reports menses are heavier since being on Eliquis but not unmanageable.  She wants to hold off on gynecology referral.  Message sent to referral coordinator to cancel referral.    This documentation was completed using Paediatric nurse.  Any transcriptional errors are unintentional.  No orders of the defined types were placed in this encounter.    Requested Prescriptions    No prescriptions requested or ordered in this encounter    No follow-ups on file.  Jonah Blue, MD, FACP

## 2023-04-18 ENCOUNTER — Other Ambulatory Visit: Payer: Self-pay | Admitting: Internal Medicine

## 2023-04-18 ENCOUNTER — Encounter: Payer: Self-pay | Admitting: Internal Medicine

## 2023-04-18 DIAGNOSIS — G1221 Amyotrophic lateral sclerosis: Secondary | ICD-10-CM

## 2023-04-22 ENCOUNTER — Ambulatory Visit: Payer: Self-pay | Attending: Internal Medicine

## 2023-04-22 DIAGNOSIS — G1221 Amyotrophic lateral sclerosis: Secondary | ICD-10-CM

## 2023-04-23 LAB — COMPREHENSIVE METABOLIC PANEL
ALT: 29 [IU]/L (ref 0–32)
AST: 24 [IU]/L (ref 0–40)
Albumin: 4.2 g/dL (ref 3.9–4.9)
Alkaline Phosphatase: 58 [IU]/L (ref 44–121)
BUN/Creatinine Ratio: 15 (ref 9–23)
BUN: 9 mg/dL (ref 6–24)
Bilirubin Total: 0.3 mg/dL (ref 0.0–1.2)
CO2: 21 mmol/L (ref 20–29)
Calcium: 9.6 mg/dL (ref 8.7–10.2)
Chloride: 104 mmol/L (ref 96–106)
Creatinine, Ser: 0.62 mg/dL (ref 0.57–1.00)
Globulin, Total: 3 g/dL (ref 1.5–4.5)
Glucose: 117 mg/dL — ABNORMAL HIGH (ref 70–99)
Potassium: 4.5 mmol/L (ref 3.5–5.2)
Sodium: 140 mmol/L (ref 134–144)
Total Protein: 7.2 g/dL (ref 6.0–8.5)
eGFR: 113 mL/min/{1.73_m2} (ref >59)

## 2023-04-23 LAB — CBC WITH DIFFERENTIAL/PLATELET
Basophils Absolute: 0.1 10*3/uL (ref 0.0–0.2)
Basos: 1 %
EOS (ABSOLUTE): 0.3 10*3/uL (ref 0.0–0.4)
Eos: 3 %
Hematocrit: 38.7 % (ref 34.0–46.6)
Hemoglobin: 12.4 g/dL (ref 11.1–15.9)
Immature Grans (Abs): 0 10*3/uL (ref 0.0–0.1)
Immature Granulocytes: 0 %
Lymphocytes Absolute: 2.8 10*3/uL (ref 0.7–3.1)
Lymphs: 30 %
MCH: 29.2 pg (ref 26.6–33.0)
MCHC: 32 g/dL (ref 31.5–35.7)
MCV: 91 fL (ref 79–97)
Monocytes Absolute: 0.6 10*3/uL (ref 0.1–0.9)
Monocytes: 6 %
Neutrophils Absolute: 5.5 10*3/uL (ref 1.4–7.0)
Neutrophils: 60 %
Platelets: 348 10*3/uL (ref 150–450)
RBC: 4.24 x10E6/uL (ref 3.77–5.28)
RDW: 12.6 % (ref 11.7–15.4)
WBC: 9.3 10*3/uL (ref 3.4–10.8)

## 2023-04-24 ENCOUNTER — Other Ambulatory Visit: Payer: Self-pay | Admitting: Internal Medicine

## 2023-04-24 DIAGNOSIS — R739 Hyperglycemia, unspecified: Secondary | ICD-10-CM

## 2023-04-28 ENCOUNTER — Encounter: Payer: Self-pay | Admitting: Internal Medicine

## 2023-04-28 LAB — SPECIMEN STATUS REPORT

## 2023-04-28 LAB — HEMOGLOBIN A1C
Est. average glucose Bld gHb Est-mCnc: 100 mg/dL
Hgb A1c MFr Bld: 5.1 % (ref 4.8–5.6)

## 2023-05-04 ENCOUNTER — Telehealth: Payer: Self-pay | Admitting: *Deleted

## 2023-05-04 ENCOUNTER — Other Ambulatory Visit: Payer: Self-pay

## 2023-05-04 NOTE — Telephone Encounter (Signed)
Per patient and husband, Maria Lynch:   [name] will not reimburse them 800.00 because they need a letter from Dr. Laural Benes validating that Eliquis was needed for a medical emergency.   Please assist by writing a letter to justify why the Eliquis was needed so they are able to be reimbursed.

## 2023-05-04 NOTE — Telephone Encounter (Signed)
Just printed letter.  Please leave on my desk for signature. Pt can pick up tomorrow.

## 2023-05-04 NOTE — Telephone Encounter (Signed)
Copied from CRM [id number]. Topic: Transportation - Transportation >> May 04, 2023  3:59 PM Phill Myron wrote: Pt. Maria Lynch is asking for the letter, regarding transportation to be mailed to her home.

## 2023-05-04 NOTE — Telephone Encounter (Signed)
Called & LVM informing patient that letter will be ready tomorrow 05/05/2023 for pick-up.

## 2023-05-05 NOTE — Telephone Encounter (Signed)
Transportation form signed and successfully emailed to [email or web address] on 05/05/2023. Original form mailed to patient's home.

## 2023-05-16 ENCOUNTER — Encounter: Payer: Self-pay | Admitting: Internal Medicine

## 2023-05-17 ENCOUNTER — Other Ambulatory Visit: Payer: Self-pay | Admitting: Family Medicine

## 2023-05-17 MED ORDER — SULFAMETHOXAZOLE-TRIMETHOPRIM 800-160 MG PO TABS: 1.0000 | ORAL_TABLET | Freq: Two times a day (BID) | ORAL | 0 refills | Status: DC

## 2023-05-19 ENCOUNTER — Encounter: Payer: Self-pay | Admitting: Internal Medicine

## 2023-06-01 ENCOUNTER — Other Ambulatory Visit: Payer: Self-pay | Admitting: Internal Medicine

## 2023-06-01 DIAGNOSIS — I82411 Acute embolism and thrombosis of right femoral vein: Secondary | ICD-10-CM

## 2023-06-01 NOTE — Telephone Encounter (Signed)
 Medication Refill -  Most Recent Primary Care Visit:  Provider: [facility]  Department: [facility]  Visit Type: LAB  Date: 04/22/2023    Medication: apixaban (ELIQUIS) 5 MG TABS tablet [[id number]   (requesting automatic refills)   Has the patient contacted their pharmacy? No (Agent: If no, request that the patient contact the pharmacy for the refill. If patient does not wish to contact the pharmacy document the reason why and proceed with request.) (Agent: If yes, when and what did the pharmacy advise?)  Is this the correct pharmacy for this prescription? Yes If no, delete pharmacy and type the correct one.  This is the patient's preferred pharmacy:  [facility] - [place], [place] - [address] [place] [address] Phone: [phone] Fax: [phone]     Is the patient out of the medication? Yes  Has the patient been seen for an appointment in the last year OR does the patient have an upcoming appointment? Yes  Can we respond through MyChart? Yes  Agent: Please be advised that Rx refills may take up to 3 business days. We ask that you follow-up with your pharmacy.

## 2023-06-02 MED ORDER — APIXABAN 5 MG PO TABS: 5.0000 mg | ORAL_TABLET | Freq: Two times a day (BID) | ORAL | 3 refills | Status: DC

## 2023-06-02 NOTE — Telephone Encounter (Signed)
Requested Prescriptions  Pending Prescriptions Disp Refills   apixaban (ELIQUIS) 5 MG TABS tablet 180 tablet 3    Sig: Take 1 tablet (5 mg total) by mouth 2 (two) times daily.     Hematology:  Anticoagulants - apixaban Passed - 06/02/2023  9:16 AM      Passed - PLT in normal range and within 360 days    Platelets  Date Value Ref Range Status  04/22/2023 348 150 - 450 x10E3/uL Final         Passed - HGB in normal range and within 360 days    Hemoglobin  Date Value Ref Range Status  04/22/2023 12.4 11.1 - 15.9 g/dL Final         Passed - HCT in normal range and within 360 days    Hematocrit  Date Value Ref Range Status  04/22/2023 38.7 34.0 - 46.6 % Final         Passed - Cr in normal range and within 360 days    Creatinine, Ser  Date Value Ref Range Status  04/22/2023 0.62 0.57 - 1.00 mg/dL Final         Passed - AST in normal range and within 360 days    AST  Date Value Ref Range Status  04/22/2023 24 0 - 40 IU/L Final         Passed - ALT in normal range and within 360 days    ALT  Date Value Ref Range Status  04/22/2023 29 0 - 32 IU/L Final         Passed - Valid encounter within last 12 months    Recent Outpatient Visits           1 month ago History of DVT (deep vein thrombosis)   [facility] [place]. [facility] Jonah Blue B, MD   3 months ago Acute deep vein thrombosis (DVT) of femoral vein of right lower extremity [facility])   [facility] [place]. [facility] Marcine Matar, MD   8 months ago Chronic right shoulder pain   [facility] [place]. [facility] Marcine Matar, MD   1 year ago ALS (amyotrophic lateral sclerosis) [facility])   [facility] - A Dept Of . [facility] Marcine Matar, MD   1 year ago Gait disturbance   [facility] - [facility] Dept Of . [facility] Marcine Matar, MD

## 2023-06-09 ENCOUNTER — Encounter: Payer: Self-pay | Admitting: Internal Medicine

## 2023-06-10 ENCOUNTER — Other Ambulatory Visit: Payer: Self-pay | Admitting: Internal Medicine

## 2023-06-10 MED ORDER — APIXABAN 5 MG PO TABS: 5.0000 mg | ORAL_TABLET | Freq: Two times a day (BID) | ORAL | 0 refills | Status: DC

## 2023-06-13 ENCOUNTER — Telehealth: Payer: Self-pay

## 2023-06-13 NOTE — Telephone Encounter (Signed)
Copied from CRM [id number]. Topic: Clinical - Prescription Issue >> Jun 13, 2023 10:37 AM Hector Shade B wrote: Reason for CRM:  apixaban (ELIQUIS) 5 MG TABS tablet Maisie Fus from [facility] has stated that the authorization for this medication needs to backdated for September 2024  [name] [phone], Maisie Fus from pharmacy department.

## 2023-06-20 ENCOUNTER — Encounter: Payer: Self-pay | Admitting: Internal Medicine

## 2023-06-21 ENCOUNTER — Other Ambulatory Visit: Payer: Self-pay | Admitting: Internal Medicine

## 2023-06-21 DIAGNOSIS — I82411 Acute embolism and thrombosis of right femoral vein: Secondary | ICD-10-CM

## 2023-06-21 NOTE — Telephone Encounter (Signed)
 Copied from CRM [id number]. Topic: Clinical - Prescription Issue >> Jun 21, 2023  2:29 PM Antonio DEL wrote: Reason for CRM: Patient called to update pharmacy because she has new insurance and request refill for eliquis  medication. She wasn't able to provide pharmacy address but she said the phone number is [phone], [facility]. Patient also stated she is wanting to follow up on eliquis  medication being backdated to September 1st for her claim with previous insurance [address]

## 2023-06-21 NOTE — Telephone Encounter (Signed)
 Copied from CRM [id number]. Topic: Clinical - Medication Refill >> Jun 21, 2023  2:21 PM Antonio DEL wrote: Most Recent Primary Care Visit:  Provider: [facility]  [facility]  Visit Type: LAB  Date: 04/22/2023  Medication: apixaban  (ELIQUIS ) 5 MG TABS  Has the patient contacted their pharmacy? No (Agent: If no, request that the patient contact the pharmacy for the refill. If patient does not wish to contact the pharmacy document the reason why and proceed with request.) (Agent: If yes, when and what did the pharmacy advise?)  Is this the correct pharmacy for this prescription? Yes If no, delete pharmacy and type the correct one.  This is the patient's preferred pharmacy:  [facility] [phone]   Has the prescription been filled recently? Yes  Is the patient out of the medication? No  Has the patient been seen for an appointment in the last year OR does the patient have an upcoming appointment? Yes  Can we respond through MyChart? Yes  Agent: Please be advised that Rx refills may take up to 3 business days. We ask that you follow-up with your pharmacy.

## 2023-06-21 NOTE — Telephone Encounter (Signed)
Call to patient unable to reach VM Left . Advised patient to call Current pharmacy with her updated insurance information.

## 2023-06-22 ENCOUNTER — Encounter: Payer: Self-pay | Admitting: Internal Medicine

## 2023-06-24 ENCOUNTER — Other Ambulatory Visit: Payer: Self-pay | Admitting: Internal Medicine

## 2023-06-24 NOTE — Telephone Encounter (Signed)
 Copied from CRM [id number]. Topic: Clinical - Medication Refill >> Jun 24, 2023  1:28 PM Yolanda T wrote: Most Recent Primary Care Visit:  Provider: [facility]  [facility]  Visit Type: LAB  Date: 04/22/2023  Medication: sertraline  (ZOLOFT ) 50 MG tablet  Has the patient contacted their pharmacy? Yes Pharmacy called to verbally order meds  Is this the correct pharmacy for this prescription? Yes If no, delete pharmacy and type the correct one.  This is the patient's preferred pharmacy:   [facility] - [place], [place] - [address]  Phone: [phone] Fax: [phone]  Has the prescription been filled recently? Yes  Is the patient out of the medication? Yes  Has the patient been seen for an appointment in the last year OR does the patient have an upcoming appointment? Yes  Can we respond through MyChart? Yes  Agent: Please be advised that Rx refills may take up to 3 business days. We ask that you follow-up with your pharmacy.

## 2023-06-24 NOTE — Telephone Encounter (Signed)
 Needs appt with PCP for refills. Of note, was recently filled last month and should have a 3 month supply.

## 2023-06-29 NOTE — Telephone Encounter (Addendum)
Maria Lynch from [place] calling to follow up on the request for the Eliquis Rx to be backdated.  Pt states she paid out of pocket for this med in Sept 2024.  She is looking to get reimbursed.  Cb [phone]  any agent can answer question

## 2023-06-30 ENCOUNTER — Other Ambulatory Visit: Payer: Self-pay

## 2023-07-26 NOTE — Telephone Encounter (Signed)
 Message received from [facility] stating patient has been certified for [employer]

## 2023-07-27 ENCOUNTER — Telehealth: Payer: Self-pay

## 2023-07-27 NOTE — Telephone Encounter (Signed)
 Copied from CRM [id number]. Topic: Clinical - Prescription Issue >> Jul 27, 2023  1:43 PM Nyra Capes wrote: Reason for CRM: Pebbles, with [employer] is asking about apixaban (ELIQUIS) 5 MG TABS tablet [employer] is asking to speak with someone from the authorization about this Rx and getting it back dated.  Please call back [phone] option 4

## 2023-08-01 ENCOUNTER — Other Ambulatory Visit: Payer: Self-pay

## 2023-08-01 NOTE — Telephone Encounter (Signed)
 Noted. Thank you so much

## 2023-08-01 NOTE — Telephone Encounter (Signed)
 Maria Lynch with [employer] Authorization completed and approved for Eliquis. Patient picked up emergency script and needing prior authorization to be backdated. Maria Lynch states she has attempted to get this resolved for about a month now.   Patient requesting for eliquis authorization to be backdated to 12/16/2022. Currently authorization is 02/15/2023. Maria Lynch believes that authorization team can only go back one month, if that is so, Maria Lynch requesting for authorization to be backdated to 01/16/2023. Plan no longer active, [employer] member ID: [id number]  Ph: [phone] option 4

## 2023-12-13 ENCOUNTER — Other Ambulatory Visit: Payer: Self-pay | Admitting: Internal Medicine

## 2023-12-13 DIAGNOSIS — F411 Generalized anxiety disorder: Secondary | ICD-10-CM

## 2024-02-24 ENCOUNTER — Encounter: Payer: Self-pay | Admitting: Internal Medicine

## 2024-02-24 ENCOUNTER — Other Ambulatory Visit: Payer: Self-pay | Admitting: Internal Medicine

## 2024-02-24 DIAGNOSIS — F411 Generalized anxiety disorder: Secondary | ICD-10-CM

## 2024-02-24 MED ORDER — BUSPIRONE HCL 15 MG PO TABS: 15.0000 mg | ORAL_TABLET | Freq: Two times a day (BID) | ORAL | 0 refills | Status: DC

## 2024-03-19 ENCOUNTER — Encounter: Payer: Self-pay | Admitting: Radiology

## 2024-05-18 ENCOUNTER — Encounter: Payer: Self-pay | Admitting: Internal Medicine

## 2024-05-18 ENCOUNTER — Other Ambulatory Visit: Payer: Self-pay | Admitting: Internal Medicine

## 2024-05-21 DIAGNOSIS — I82411 Acute embolism and thrombosis of right femoral vein: Secondary | ICD-10-CM

## 2024-05-21 MED ORDER — APIXABAN 5 MG PO TABS: 5.0000 mg | ORAL_TABLET | Freq: Two times a day (BID) | ORAL | 0 refills | Status: DC

## 2024-05-25 ENCOUNTER — Other Ambulatory Visit: Payer: Self-pay | Admitting: Internal Medicine

## 2024-05-25 DIAGNOSIS — F411 Generalized anxiety disorder: Secondary | ICD-10-CM

## 2024-05-29 ENCOUNTER — Encounter: Payer: Self-pay | Admitting: Internal Medicine

## 2024-05-29 ENCOUNTER — Ambulatory Visit: Payer: Self-pay | Admitting: Internal Medicine

## 2024-05-29 DIAGNOSIS — Z86718 Personal history of other venous thrombosis and embolism: Secondary | ICD-10-CM

## 2024-05-29 DIAGNOSIS — G1221 Amyotrophic lateral sclerosis: Secondary | ICD-10-CM

## 2024-05-29 DIAGNOSIS — F411 Generalized anxiety disorder: Secondary | ICD-10-CM | POA: Diagnosis not present

## 2024-05-29 MED ORDER — BUSPIRONE HCL 15 MG PO TABS: 15.0000 mg | ORAL_TABLET | Freq: Two times a day (BID) | ORAL | 3 refills | Status: AC

## 2024-05-29 MED ORDER — SERTRALINE HCL 50 MG PO TABS: 50.0000 mg | ORAL_TABLET | Freq: Every day | ORAL | 2 refills | Status: AC

## 2024-05-29 MED ORDER — APIXABAN 5 MG PO TABS: 5.0000 mg | ORAL_TABLET | Freq: Two times a day (BID) | ORAL | 3 refills | Status: DC

## 2024-05-29 NOTE — Progress Notes (Signed)
 Patient ID: Maria Lynch, female   DOB: [birth year], 44 y.o.   MRN: [id number] Virtual Visit via Video Note  I connected with Maria Lynch on 05/29/2024 at 4:24 PM by a video enabled telemedicine application and verified that I am speaking with the correct person using two identifiers.  Location: Patient: home Provider: Office   I discussed the limitations of evaluation and management by telemedicine and the availability of in person appointments. The patient expressed understanding and agreed to proceed.  History of Present Illness: GERD, GAD, PreDM, fibrocystic breast ds, HL, Motor neuron ds.   Discussed the use of AI scribe software for clinical note transcription with the patient, who gave verbal consent to proceed.  History of Present Illness Maria Lynch is a 44 year old female with ALS who presents for a routine follow-up.  She is currently on Eliquis  for anticoagulation due hx of DVT RT leg. Plan is for life-long anticoag due to reduced mobility from ALS. She experiences minor bleeding, such as from cat scratches, but no significant bleeding issues. Her recent blood tests show a hemoglobin level around 12 the last 3 times it was checked through [facility] clinic. I also reviewed her last few chemistry results as well on Care everywhere.  She has ALS and followed at [facility] multidisciplinary clinic for ALS.  She uses noninvasive ventilation at night with a nasal mask. She is unable to feed herself but can still chew and swallow without difficulty. She participates in a clinical trial involving a gold-containing liquid aimed at affecting mitochondrial function and takes a supplement called Basis. While she hasn't seen improvement, her condition isn't declining as quickly as expected.  She uses a motorized wheelchair both at home and outside. 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  Her current medications include Eliquis , Zoloft , and Buspar .  She takes Buspar  at a dose of 15 mg twice daily and Zoloft  for GAD and reports doing well on them. Request Rfs.     Outpatient Encounter Medications as of 05/29/2024  Medication Sig   apixaban  (ELIQUIS ) 5 MG TABS tablet Take 1 tablet (5 mg total) by mouth 2 (two) times daily.   apixaban  (ELIQUIS ) 5 MG TABS tablet Take 1 tablet (5 mg total) by mouth 2 (two) times daily.   baclofen  (LIORESAL ) 10 MG tablet 1/2 to 1 tablet 3 times a day for spasticity   busPIRone  (BUSPAR ) 15 MG tablet Take 1 tablet (15 mg total) by mouth 2 (two) times daily.   Cyanocobalamin (B-12 PO) Take 2 capsules by mouth daily.   famotidine (PEPCID) 20 MG tablet Take 20 mg by mouth daily.   fexofenadine (ALLEGRA) 180 MG tablet Take 180 mg by mouth daily as needed for allergies or rhinitis.   gabapentin (NEURONTIN) 100 MG capsule Take by mouth.   ibuprofen (ADVIL) 200 MG tablet Take 400 mg by mouth every 6 (six) hours as needed for headache or cramping.   Multiple Vitamins-Minerals (ADULT GUMMY) CHEW Chew 2 capsules by mouth daily.   riluzole  (RILUTEK ) 50 MG tablet Take 1 tablet (50 mg total) by mouth every 12 (twelve) hours.   sertraline  (ZOLOFT ) 50 MG tablet Take 1 tablet (50 mg total) by mouth daily.   [DISCONTINUED] apixaban  (ELIQUIS ) 5 MG TABS tablet Take 1 tablet (5 mg total) by mouth 2 (two) times daily.   [DISCONTINUED] busPIRone  (BUSPAR ) 15 MG tablet TAKE 1 TABLET(15 MG) BY MOUTH TWICE DAILY   [DISCONTINUED] sertraline  (ZOLOFT ) 50 MG tablet  TAKE 1 TABLET DAILY   [DISCONTINUED] sulfamethoxazole -trimethoprim  (BACTRIM  DS) 800-160 MG tablet Take 1 tablet by mouth 2 (two) times daily.   No facility-administered encounter medications on file as of 05/29/2024.      Observations/Objective: Pt sitting in chair in NAD. Speech is slow   Assessment and Plan: 1. ALS (amyotrophic lateral sclerosis) (HCC) (Primary) Patient continues to be followed by [facility] multidisciplinary ALS clinic where she sees a neurologist,  pulmonologist, nutritionist, OT and PT.  She is on an experimental medication.  She remains mobility wheelchair dependent and uses NIV at nights.  2. GAD (generalized anxiety disorder) Stable on Zoloft  and BuSpar .  Refills sent to her pharmacy. - sertraline  (ZOLOFT ) 50 MG tablet; Take 1 tablet (50 mg total) by mouth daily.  Dispense: 90 tablet; Refill: 2 - busPIRone  (BUSPAR ) 15 MG tablet; Take 1 tablet (15 mg total) by mouth 2 (two) times daily.  Dispense: 180 tablet; Refill: 3  3. History of DVT (deep vein thrombosis) Stable on Eliquis .  CBC is stable.  Plan is for lifelong anticoagulation.  Report any excessive bruising or bleeding. - apixaban  (ELIQUIS ) 5 MG TABS tablet; Take 1 tablet (5 mg total) by mouth 2 (two) times daily.  Dispense: 180 tablet; Refill: 3   Follow Up Instructions: yearly   I discussed the assessment and treatment plan with the patient. The patient was provided an opportunity to ask questions and all were answered. The patient agreed with the plan and demonstrated an understanding of the instructions.   The patient was advised to call back or seek an in-person evaluation if the symptoms worsen or if the condition fails to improve as anticipated.  I spent 12 minutes dedicated to the care of this patient on the date of this encounter to include previsit review of my last note, review of last note from pulmonologist, neurologist at [facility], review of last 3 sets of CBC/chem, face to face time with pt discussing her issues and post visit entering orders.   This note has been created with Education officer, environmental. Any transcriptional errors are unintentional.  Barnie Louder, MD

## 2024-06-13 ENCOUNTER — Encounter: Payer: Self-pay | Admitting: Internal Medicine

## 2024-06-14 ENCOUNTER — Other Ambulatory Visit: Payer: Self-pay | Admitting: Pharmacist

## 2024-06-14 DIAGNOSIS — Z86718 Personal history of other venous thrombosis and embolism: Secondary | ICD-10-CM

## 2024-06-14 MED ORDER — APIXABAN 5 MG PO TABS: 5.0000 mg | ORAL_TABLET | Freq: Two times a day (BID) | ORAL | 3 refills | Status: AC

## 2024-06-22 ENCOUNTER — Encounter: Payer: Self-pay | Admitting: Internal Medicine
